# Patient Record
Sex: Male | Born: 1935 | Race: White | Hispanic: No | Smoking: Current every day smoker
Health system: Southern US, Community
[De-identification: ages and names within clinical notes are randomized; demographics above are authoritative.]

## PROBLEM LIST (undated history)

## (undated) DIAGNOSIS — I82409 Acute embolism and thrombosis of unspecified deep veins of unspecified lower extremity: Secondary | ICD-10-CM

## (undated) DIAGNOSIS — I739 Peripheral vascular disease, unspecified: Secondary | ICD-10-CM

## (undated) DIAGNOSIS — K449 Diaphragmatic hernia without obstruction or gangrene: Secondary | ICD-10-CM

## (undated) DIAGNOSIS — I2510 Atherosclerotic heart disease of native coronary artery without angina pectoris: Secondary | ICD-10-CM

## (undated) DIAGNOSIS — I10 Essential (primary) hypertension: Secondary | ICD-10-CM

## (undated) DIAGNOSIS — F418 Other specified anxiety disorders: Secondary | ICD-10-CM

## (undated) DIAGNOSIS — I219 Acute myocardial infarction, unspecified: Secondary | ICD-10-CM

## (undated) DIAGNOSIS — I429 Cardiomyopathy, unspecified: Secondary | ICD-10-CM

## (undated) DIAGNOSIS — E782 Mixed hyperlipidemia: Secondary | ICD-10-CM

## (undated) DIAGNOSIS — F0390 Unspecified dementia without behavioral disturbance: Secondary | ICD-10-CM

## (undated) DIAGNOSIS — M1990 Unspecified osteoarthritis, unspecified site: Secondary | ICD-10-CM

## (undated) DIAGNOSIS — C3490 Malignant neoplasm of unspecified part of unspecified bronchus or lung: Secondary | ICD-10-CM

## (undated) HISTORY — DX: Malignant neoplasm of unspecified part of unspecified bronchus or lung: C34.90

## (undated) HISTORY — DX: Acute myocardial infarction, unspecified: I21.9

## (undated) HISTORY — DX: Cardiomyopathy, unspecified: I42.9

## (undated) HISTORY — DX: Unspecified osteoarthritis, unspecified site: M19.90

## (undated) HISTORY — DX: Unspecified dementia, unspecified severity, without behavioral disturbance, psychotic disturbance, mood disturbance, and anxiety: F03.90

## (undated) HISTORY — DX: Acute embolism and thrombosis of unspecified deep veins of unspecified lower extremity: I82.409

## (undated) HISTORY — DX: Diaphragmatic hernia without obstruction or gangrene: K44.9

## (undated) HISTORY — DX: Peripheral vascular disease, unspecified: I73.9

## (undated) HISTORY — DX: Atherosclerotic heart disease of native coronary artery without angina pectoris: I25.10

## (undated) HISTORY — DX: Essential (primary) hypertension: I10

## (undated) HISTORY — DX: Mixed hyperlipidemia: E78.2

## (undated) HISTORY — DX: Other specified anxiety disorders: F41.8

---

## 1997-03-07 HISTORY — PX: LUNG LOBECTOMY: SHX167

## 1997-03-07 HISTORY — PX: PR VEIN BYPASS GRAFT,AORTO-FEM-POP: 35551

## 1997-08-06 ENCOUNTER — Ambulatory Visit: Admission: RE | Admit: 1997-08-06 | Discharge: 1997-08-06 | Payer: Self-pay | Admitting: *Deleted

## 1997-08-18 ENCOUNTER — Ambulatory Visit (HOSPITAL_COMMUNITY): Admission: RE | Admit: 1997-08-18 | Discharge: 1997-08-18 | Payer: Self-pay

## 1997-09-18 ENCOUNTER — Inpatient Hospital Stay
Admission: RE | Admit: 1997-09-18 | Discharge: 1997-09-22 | Payer: Self-pay | Admitting: Thoracic Surgery (Cardiothoracic Vascular Surgery)

## 1998-01-07 ENCOUNTER — Ambulatory Visit: Admission: RE | Admit: 1998-01-07 | Discharge: 1998-01-07 | Payer: Self-pay | Admitting: *Deleted

## 1998-01-13 ENCOUNTER — Encounter: Payer: Self-pay | Admitting: *Deleted

## 1998-01-14 ENCOUNTER — Inpatient Hospital Stay: Admission: RE | Admit: 1998-01-14 | Discharge: 1998-01-17 | Payer: Self-pay | Admitting: *Deleted

## 2006-03-07 DIAGNOSIS — I219 Acute myocardial infarction, unspecified: Secondary | ICD-10-CM

## 2006-03-07 HISTORY — DX: Acute myocardial infarction, unspecified: I21.9

## 2007-11-01 ENCOUNTER — Ambulatory Visit: Payer: Self-pay | Admitting: *Deleted

## 2008-05-08 ENCOUNTER — Ambulatory Visit: Payer: Self-pay | Admitting: *Deleted

## 2008-07-02 ENCOUNTER — Ambulatory Visit: Payer: Self-pay | Admitting: *Deleted

## 2008-07-02 ENCOUNTER — Ambulatory Visit (HOSPITAL_COMMUNITY): Admission: RE | Admit: 2008-07-02 | Discharge: 2008-07-02 | Payer: Self-pay | Admitting: *Deleted

## 2008-09-15 ENCOUNTER — Inpatient Hospital Stay (HOSPITAL_COMMUNITY): Admission: RE | Admit: 2008-09-15 | Discharge: 2008-09-22 | Payer: Self-pay | Admitting: *Deleted

## 2008-09-15 ENCOUNTER — Ambulatory Visit: Payer: Self-pay | Admitting: *Deleted

## 2008-09-15 HISTORY — PX: PR VEIN BYPASS GRAFT,AORTO-FEM-POP: 35551

## 2008-09-17 ENCOUNTER — Encounter (INDEPENDENT_AMBULATORY_CARE_PROVIDER_SITE_OTHER): Payer: Self-pay | Admitting: *Deleted

## 2008-09-18 ENCOUNTER — Ambulatory Visit: Payer: Self-pay | Admitting: Physical Medicine & Rehabilitation

## 2008-09-26 ENCOUNTER — Ambulatory Visit: Payer: Self-pay | Admitting: Vascular Surgery

## 2008-10-23 ENCOUNTER — Ambulatory Visit: Payer: Self-pay | Admitting: *Deleted

## 2008-11-13 ENCOUNTER — Emergency Department (HOSPITAL_COMMUNITY): Admission: EM | Admit: 2008-11-13 | Discharge: 2008-11-13 | Payer: Self-pay | Admitting: Emergency Medicine

## 2008-11-14 ENCOUNTER — Ambulatory Visit: Payer: Self-pay | Admitting: Vascular Surgery

## 2009-01-01 ENCOUNTER — Ambulatory Visit: Payer: Self-pay | Admitting: Surgery

## 2009-04-01 ENCOUNTER — Emergency Department (HOSPITAL_COMMUNITY): Admission: EM | Admit: 2009-04-01 | Discharge: 2009-04-01 | Payer: Self-pay | Admitting: Emergency Medicine

## 2009-08-14 ENCOUNTER — Ambulatory Visit: Payer: Self-pay | Admitting: Vascular Surgery

## 2010-05-23 LAB — CBC
HCT: 43.9 % (ref 39.0–52.0)
Hemoglobin: 15 g/dL (ref 13.0–17.0)
MCV: 97.8 fL (ref 78.0–100.0)
RDW: 15.8 % — ABNORMAL HIGH (ref 11.5–15.5)

## 2010-05-23 LAB — BLOOD GAS, ARTERIAL
Bicarbonate: 25.4 mEq/L — ABNORMAL HIGH (ref 20.0–24.0)
O2 Saturation: 97.2 %
Patient temperature: 98.6
TCO2: 21.8 mmol/L (ref 0–100)

## 2010-05-23 LAB — BASIC METABOLIC PANEL
CO2: 26 mEq/L (ref 19–32)
Chloride: 103 mEq/L (ref 96–112)
GFR calc non Af Amer: >60 mL/min (ref >60)
Glucose, Bld: 97 mg/dL (ref 70–99)
Potassium: 3.2 mEq/L — ABNORMAL LOW (ref 3.5–5.1)
Sodium: 138 mEq/L (ref 135–145)

## 2010-05-23 LAB — HEPATIC FUNCTION PANEL
ALT: 13 U/L (ref 0–53)
AST: 19 U/L (ref 0–37)
Bilirubin, Direct: 0.3 mg/dL (ref 0.0–0.3)
Total Bilirubin: 1 mg/dL (ref 0.3–1.2)

## 2010-05-23 LAB — DIFFERENTIAL
Basophils Absolute: 0 10*3/uL (ref 0.0–0.1)
Eosinophils Absolute: 0.1 10*3/uL (ref 0.0–0.7)
Eosinophils Relative: 1 % (ref 0–5)
Lymphocytes Relative: 14 % (ref 12–46)
Monocytes Absolute: 0.7 10*3/uL (ref 0.1–1.0)

## 2010-05-23 LAB — URINALYSIS, ROUTINE W REFLEX MICROSCOPIC
Glucose, UA: NEGATIVE mg/dL
Ketones, ur: NEGATIVE mg/dL
Nitrite: NEGATIVE
pH: 7 (ref 5.0–8.0)

## 2010-06-11 LAB — COMPREHENSIVE METABOLIC PANEL
Alkaline Phosphatase: 94 U/L (ref 39–117)
BUN: 6 mg/dL (ref 6–23)
Calcium: 9 mg/dL (ref 8.4–10.5)
Glucose, Bld: 107 mg/dL — ABNORMAL HIGH (ref 70–99)
Total Protein: 6.5 g/dL (ref 6.0–8.3)

## 2010-06-11 LAB — DIFFERENTIAL
Basophils Relative: 1 % (ref 0–1)
Monocytes Relative: 7 % (ref 3–12)
Neutro Abs: 9.8 10*3/uL — ABNORMAL HIGH (ref 1.7–7.7)
Neutrophils Relative %: 70 % (ref 43–77)

## 2010-06-11 LAB — CBC
HCT: 44.1 % (ref 39.0–52.0)
Hemoglobin: 15 g/dL (ref 13.0–17.0)
MCHC: 34.1 g/dL (ref 30.0–36.0)
RDW: 16.4 % — ABNORMAL HIGH (ref 11.5–15.5)

## 2010-06-11 LAB — PROTIME-INR
INR: 1 (ref 0.00–1.49)
Prothrombin Time: 13.2 seconds (ref 11.6–15.2)

## 2010-06-11 LAB — APTT: aPTT: 28 seconds (ref 24–37)

## 2010-06-13 LAB — POCT I-STAT 7, (LYTES, BLD GAS, ICA,H+H)
Bicarbonate: 30 mEq/L — ABNORMAL HIGH (ref 20.0–24.0)
Calcium, Ion: 1.13 mmol/L (ref 1.12–1.32)
HCT: 23 % — ABNORMAL LOW (ref 39.0–52.0)
Hemoglobin: 7.8 g/dL — CL (ref 13.0–17.0)
pCO2 arterial: 51.6 mmHg — ABNORMAL HIGH (ref 35.0–45.0)
pH, Arterial: 7.366 (ref 7.350–7.450)
pO2, Arterial: 430 mmHg — ABNORMAL HIGH (ref 80.0–100.0)

## 2010-06-13 LAB — GLUCOSE, CAPILLARY
Glucose-Capillary: 104 mg/dL — ABNORMAL HIGH (ref 70–99)
Glucose-Capillary: 110 mg/dL — ABNORMAL HIGH (ref 70–99)
Glucose-Capillary: 144 mg/dL — ABNORMAL HIGH (ref 70–99)
Glucose-Capillary: 145 mg/dL — ABNORMAL HIGH (ref 70–99)
Glucose-Capillary: 159 mg/dL — ABNORMAL HIGH (ref 70–99)

## 2010-06-13 LAB — BASIC METABOLIC PANEL
BUN: 4 mg/dL — ABNORMAL LOW (ref 6–23)
BUN: 5 mg/dL — ABNORMAL LOW (ref 6–23)
BUN: 7 mg/dL (ref 6–23)
BUN: 8 mg/dL (ref 6–23)
CO2: 25 mEq/L (ref 19–32)
CO2: 26 mEq/L (ref 19–32)
CO2: 24 mEq/L (ref 19–32)
CO2: 25 mEq/L (ref 19–32)
Calcium: 7.1 mg/dL — ABNORMAL LOW (ref 8.4–10.5)
Calcium: 7.3 mg/dL — ABNORMAL LOW (ref 8.4–10.5)
Calcium: 7.7 mg/dL — ABNORMAL LOW (ref 8.4–10.5)
Chloride: 105 mEq/L (ref 96–112)
Chloride: 101 mEq/L (ref 96–112)
Chloride: 103 mEq/L (ref 96–112)
Creatinine, Ser: 0.6 mg/dL (ref 0.4–1.5)
Creatinine, Ser: 0.65 mg/dL (ref 0.4–1.5)
Creatinine, Ser: 0.71 mg/dL (ref 0.4–1.5)
Creatinine, Ser: 0.7 mg/dL (ref 0.4–1.5)
GFR calc Af Amer: >60 mL/min (ref >60)
GFR calc Af Amer: >60 mL/min (ref >60)
GFR calc Af Amer: >60 mL/min (ref >60)
GFR calc non Af Amer: >60 mL/min (ref >60)
GFR calc non Af Amer: >60 mL/min (ref >60)
GFR calc non Af Amer: >60 mL/min (ref >60)
Glucose, Bld: 104 mg/dL — ABNORMAL HIGH (ref 70–99)
Glucose, Bld: 81 mg/dL (ref 70–99)
Glucose, Bld: 168 mg/dL — ABNORMAL HIGH (ref 70–99)
Potassium: 3.7 mEq/L (ref 3.5–5.1)
Potassium: 2.7 mEq/L — CL (ref 3.5–5.1)
Potassium: 3 mEq/L — ABNORMAL LOW (ref 3.5–5.1)
Sodium: 132 mEq/L — ABNORMAL LOW (ref 135–145)
Sodium: 134 mEq/L — ABNORMAL LOW (ref 135–145)
Sodium: 131 mEq/L — ABNORMAL LOW (ref 135–145)

## 2010-06-13 LAB — TYPE AND SCREEN
ABO/RH(D): A POS
Antibody Screen: NEGATIVE

## 2010-06-13 LAB — DIFFERENTIAL
Basophils Relative: 1 % (ref 0–1)
Eosinophils Absolute: 0.2 10*3/uL (ref 0.0–0.7)
Eosinophils Relative: 1 % (ref 0–5)
Lymphs Abs: 3.9 10*3/uL (ref 0.7–4.0)
Monocytes Relative: 5 % (ref 3–12)
Neutrophils Relative %: 74 % (ref 43–77)

## 2010-06-13 LAB — CBC
HCT: 20.3 % — ABNORMAL LOW (ref 39.0–52.0)
HCT: 30.3 % — ABNORMAL LOW (ref 39.0–52.0)
HCT: 31 % — ABNORMAL LOW (ref 39.0–52.0)
HCT: 31.8 % — ABNORMAL LOW (ref 39.0–52.0)
HCT: 48.9 % (ref 39.0–52.0)
Hemoglobin: 10.4 g/dL — ABNORMAL LOW (ref 13.0–17.0)
Hemoglobin: 7.1 g/dL — CL (ref 13.0–17.0)
Hemoglobin: 8.6 g/dL — ABNORMAL LOW (ref 13.0–17.0)
MCHC: 33.9 g/dL (ref 30.0–36.0)
MCHC: 35.5 g/dL (ref 30.0–36.0)
MCHC: 34.5 g/dL (ref 30.0–36.0)
MCHC: 35 g/dL (ref 30.0–36.0)
MCHC: 35.5 g/dL (ref 30.0–36.0)
MCHC: 36.1 g/dL — ABNORMAL HIGH (ref 30.0–36.0)
MCV: 93.1 fL (ref 78.0–100.0)
MCV: 93.5 fL (ref 78.0–100.0)
MCV: 91.2 fL (ref 78.0–100.0)
MCV: 93.6 fL (ref 78.0–100.0)
MCV: 95.1 fL (ref 78.0–100.0)
MCV: 95.2 fL (ref 78.0–100.0)
MCV: 95.8 fL (ref 78.0–100.0)
Platelets: 146 10*3/uL — ABNORMAL LOW (ref 150–400)
Platelets: 145 10*3/uL — ABNORMAL LOW (ref 150–400)
Platelets: 162 10*3/uL (ref 150–400)
Platelets: 193 10*3/uL (ref 150–400)
Platelets: 290 10*3/uL (ref 150–400)
RBC: 2.62 MIL/uL — ABNORMAL LOW (ref 4.22–5.81)
RBC: 3.16 MIL/uL — ABNORMAL LOW (ref 4.22–5.81)
RBC: 3.34 MIL/uL — ABNORMAL LOW (ref 4.22–5.81)
RDW: 16.2 % — ABNORMAL HIGH (ref 11.5–15.5)
RDW: 16.5 % — ABNORMAL HIGH (ref 11.5–15.5)
RDW: 15.5 % (ref 11.5–15.5)
RDW: 15.9 % — ABNORMAL HIGH (ref 11.5–15.5)
RDW: 16 % — ABNORMAL HIGH (ref 11.5–15.5)
WBC: 8.8 10*3/uL (ref 4.0–10.5)
WBC: 12.1 10*3/uL — ABNORMAL HIGH (ref 4.0–10.5)
WBC: 14.3 10*3/uL — ABNORMAL HIGH (ref 4.0–10.5)
WBC: 15.7 10*3/uL — ABNORMAL HIGH (ref 4.0–10.5)
WBC: 19.7 10*3/uL — ABNORMAL HIGH (ref 4.0–10.5)
WBC: 9.9 10*3/uL (ref 4.0–10.5)

## 2010-06-13 LAB — ABO/RH: ABO/RH(D): A POS

## 2010-06-13 LAB — COMPREHENSIVE METABOLIC PANEL
AST: 23 U/L (ref 0–37)
Albumin: 3.8 g/dL (ref 3.5–5.2)
BUN: 8 mg/dL (ref 6–23)
Calcium: 9.5 mg/dL (ref 8.4–10.5)
Chloride: 95 mEq/L — ABNORMAL LOW (ref 96–112)
Creatinine, Ser: 0.92 mg/dL (ref 0.4–1.5)
GFR calc Af Amer: >60 mL/min (ref >60)
Total Bilirubin: 1.4 mg/dL — ABNORMAL HIGH (ref 0.3–1.2)
Total Protein: 6.7 g/dL (ref 6.0–8.3)

## 2010-06-13 LAB — APTT: aPTT: 32 seconds (ref 24–37)

## 2010-06-13 LAB — POTASSIUM
Potassium: 3 mEq/L — ABNORMAL LOW (ref 3.5–5.1)
Potassium: 3.5 mEq/L (ref 3.5–5.1)

## 2010-06-13 LAB — CARDIAC PANEL(CRET KIN+CKTOT+MB+TROPI)
CK, MB: 3 ng/mL (ref 0.3–4.0)
Relative Index: 2.4 (ref 0.0–2.5)
Relative Index: 2.6 — ABNORMAL HIGH (ref 0.0–2.5)
Relative Index: INVALID (ref 0.0–2.5)
Troponin I: 0.02 ng/mL (ref 0.00–0.06)

## 2010-06-13 LAB — MRSA PCR SCREENING: MRSA by PCR: NEGATIVE

## 2010-06-13 LAB — PROTIME-INR: INR: 1 (ref 0.00–1.49)

## 2010-06-13 LAB — URINALYSIS, ROUTINE W REFLEX MICROSCOPIC
Nitrite: NEGATIVE
Protein, ur: NEGATIVE mg/dL
Specific Gravity, Urine: 1.018 (ref 1.005–1.030)
Urobilinogen, UA: 2 mg/dL — ABNORMAL HIGH (ref 0.0–1.0)

## 2010-06-16 LAB — POCT I-STAT, CHEM 8
Chloride: 93 mEq/L — ABNORMAL LOW (ref 96–112)
HCT: 53 % — ABNORMAL HIGH (ref 39.0–52.0)
Potassium: 3.3 mEq/L — ABNORMAL LOW (ref 3.5–5.1)
Sodium: 130 mEq/L — ABNORMAL LOW (ref 135–145)

## 2010-07-20 NOTE — Consult Note (Signed)
VASCULAR SURGERY CONSULTATION   [name], [name]  DOB:  Dec 02, 1933                                       11/01/2007  ZOXWR#:[id number]   CHIEF COMPLAINT:  Bilateral leg pain and weakness.   HISTORY:  The patient is a 77 year old gentleman with a history of lower  extremity revascularization.  He underwent right common femoral  endarterectomy, right-to-left femoral-femoral bypass, and right femoral  popliteal bypass with with saphenous vein graft in November of 1999.  He  has been lost to follow-up since 1999.  His last ankle brachial indices  measured in this office were in December of 1999, measuring 0.64 on the  right leg and 0.56 on the left leg.   He underwent Dopplers today with ankle brachial indices 0.50 on the  right and 0.31 on the left.   His current symptoms relate to weakness in his legs and an inability to  walk, he states he can only walk 15-20 yards.  Does not have any night  pain or rest pain.  He ambulates with the assistance of a cane.   PAST MEDICAL HISTORY:  1. Hypertension.  2. Hyperlipidemia.  3. Coronary artery disease.  4. Tobacco abuse.  5. Lung cancer.   MEDICATIONS:  1. Hydrochlorothiazide 25 mg daily.  2. Simvastatin 40 mg daily.  3. Metoprolol 25 mg daily.   ALLERGIES:  None known.   FAMILY HISTORY:  Noncontributory.   SOCIAL HISTORY:  The patient is single.  He has 2 grown children.  He  continues to smoke 1 to 1-1/2 packs of cigarettes daily.  Does not  consume alcohol.   REVIEW OF SYSTEMS:  Refer to Patient Encounter Form.  The patient notes  weight loss, he weighs 175 pounds, 6 feet tall.  He has a rash in his  groins.  He describes arthritic and joint pain.  Chronic muscle pain.  Unsteady gait.  Refer to Patient Encounter Form.   PHYSICAL EXAMINATION:  General:  A moderately frail, debilitated  appearing, 77 year old male.  He is alert and oriented.  In no acute  distress.  Chest:  Equal air entry bilaterally without  rales or rhonchi.  Cardiovascular:  Normal heart sounds without murmurs.  No carotid  bruits.  Abdomen:  Soft, nontender.  No masses or organomegaly.  Lower  Extremities:  Absent femoral, popliteal, posterior tibial, and dorsalis  pedis pulses bilaterally.  Occluded right-to-left femoral-femoral  bypass.  Feet revealed moderate dependent rubor.  No ulceration or  gangrene.   IMPRESSION:  1. Advanced multilevel peripheral vascular disease.  Failed right-to-      left femoral-femoral bypass and right femoral popliteal bypass.  2. Coronary artery disease.  3. Hypertension.  4. Hyperlipidemia.  5. Lung carcinoma.  6. Ongoing tobacco abuse.   RECOMMENDATION:  Although the patient does have evidence of advanced  peripheral vascular disease, he is at no risk of immediate limb loss.  He appears to be moderate to severely debilitated and has limited  ambulation intolerance due to multiple factors beyond just his vascular  disease.  I have recommended no further workup at this time and would  not plan intervention unless limb salvage situation became apparent.   Balinda Quails, M.D.  Electronically Signed  PGH/MEDQ  D:  11/01/2007  T:  11/02/2007  Job:  [id number]   cc:   Delaney Meigs,  M.D. 

## 2010-07-20 NOTE — Procedures (Signed)
BYPASS GRAFT EVALUATION   INDICATION:  Followup left axillary bifemoral/right fem-pop bypass  graft.   HISTORY:  Diabetes:  No.  Cardiac:  No.  Hypertension:  Yes.  Smoking:  Yes.  Previous Surgery:  Left axillary to bifemoral bypass graft/right femoral-  popliteal bypass graft on 09/15/2008 by Dr. Madilyn Fireman.   SINGLE LEVEL ARTERIAL EXAM                               RIGHT              LEFT  Brachial:                    154                133  Anterior tibial:             140                60  Posterior tibial:            135                60  Peroneal:  Ankle/brachial index:        0.91               0.39   PREVIOUS ABI:  Date:  01/01/2009  RIGHT:  0.88  LEFT:  0.49   LOWER EXTREMITY BYPASS GRAFT DUPLEX EXAM:   DUPLEX:  Patent left axillary to bifemoral and right fem-pop bypass  graft, elevated velocities of 373 cm at the proximal left axillary to  bifemoral bypass graft.   IMPRESSION:  1. Decrease in left ankle brachial index from previous study which      still suggests severe arterial disease.  2. Stable right ankle brachial index.  3. Patent left axillary to bifemoral and right femoral to popliteal      bypass graft with elevated velocities as described above.   ___________________________________________  Larina Earthly, M.D.   CB/MEDQ  D:  08/14/2009  T:  08/14/2009  Job:  [id number]

## 2010-07-20 NOTE — Op Note (Signed)
NAME:  CASEAntwoine, Zorn NO.:  [id number]   MEDICAL RECORD NO.:  [id number]          PATIENT TYPE:  INP   LOCATION:  2304                         FACILITY:  [facility]   PHYSICIAN:  Balinda Quails, M.D.    DATE OF BIRTH:  1933/12/30   DATE OF PROCEDURE:  09/15/2008  DATE OF DISCHARGE:                               OPERATIVE REPORT   SURGEON:  Balinda Quails, MD   ASSISTANTS:  1. Di Kindle.  Edilia Bo, MD  2. Wilmon Arms, PA   ANESTHETIC:  General endotracheal.   ANESTHESIOLOGIST:  Kaylyn Layer. Ossey, MD   PREOPERATIVE DIAGNOSIS:  Bilateral lower extremity ischemic rest pain.   POSTOPERATIVE DIAGNOSIS:  Bilateral lower extremity ischemic rest pain.   PROCEDURES:  1. Left axillobifemoral bypass.  2. Left profundoplasty.  3. Repair of right femoral pseudoaneurysm.  4. Reimplantation of right femoral-popliteal bypass.   CLINICAL NOTE:  Boston Thayer is a 77 year old male with a history of  known peripheral vascular disease.  Approximately 10 years ago he  underwent right-to-left femoral-femoral bypass and right femoral-  popliteal bypass.  He was lost to followup for approximately 10 years.  Represented with bilateral ischemic rest pain.  Workup for this revealed  an occluded infrarenal aorta.  Occluded right-to-left femoral-femoral  bypass.  Patent right femoral-popliteal graft.  Severely diseased left  superficial femoral artery with subtotal occlusion.   Brought to the operating room at this time for left axillobifemoral  bypass.   OPERATIVE PROCEDURE:  The patient was brought to the operating room in  stable condition.  Placed under general endotracheal anesthesia.  In the  supine position the abdomen and both legs were prepped and draped in a  sterile fashion.  Foley catheter and arterial line in place.   Transverse skin incision made in the left infraclavicular space.  Subcutaneous tissue divided with electrocautery.  The left pectoralis  major muscle  was divided.  The pectoralis minor muscle was retracted  laterally.  Left axillary artery was exposed.  Brachial plexus was  identified coursing over the axillary artery and was preserved.  Tributaries of the axillary artery were freed, ligated with 3-0 silk and  divided.  Adequate length of axillary artery was mobilized and encircled  with a vessel loop.   A longitudinal skin incision made through the left groin.  Subcutaneous  tissue divided with electrocautery.  This was a redo groin.  Dissection  carried down to expose an occluded left limb of the femoral-femoral  graft.  This was followed down to an end-to-side anastomosis to the left  common femoral artery extending on the left superficial femoral artery.  The left common femoral artery was mobilized back up under the inguinal  ligament and the left external iliac artery was encircled with the  vessel loop.  The femoral-femoral graft chronically occluded was divided  allowing mobilization of the common femoral artery.  The left  superficial femoral artery was freed and encircled with the vessel loop  and mobilized distally.  The left profunda femoris artery origin  identified.  This was occluded proximally, mobilized distally  down to  the first major division branches which were encircled with vessel  loops.   Longitudinal skin incision then made in the right groin.  This was also  redo groin.  Subcutaneous tissue divided with electrocautery.  A right  femoral pseudoaneurysm was identified.  This was arising from the hood  of the right-to-left femoral-femoral graft.  The right femoral-popliteal  vein graft was identified.  This was freed, dissected out, mobilized and  encircled with the vessel loop.  This had separated from the hood of the  femoral-femoral graft and was mobilized and controlled with a bulldog  clamp.  The pseudoaneurysm was then dissected out and the common femoral  artery exposed.  The right profunda femoris  artery was occluded at its  origin.   The right external iliac artery was mobilized up and the inguinal  ligament encircled with vessel loop.   A long tunnel was then created deep to the pectoralis muscles extending  from the left axillary incision to the left groin.  An 8-mm Propaten  ringed graft was placed through the tunnel.  A redo femoral-femoral  tunnel was then created in suprapubic plane and a 7-mm Dacron graft  placed through this.   The patient was then administered 7000 units of heparin intravenously.   The left axillary artery was controlled proximally and distally with  bulldog clamps.  A longitudinal arteriotomy made.  The Propaten 8 mm  Gore-Tex graft was beveled and anastomosed end-to-side to the left  axillary artery using running 6-0 Prolene suture.  At completion of this  anastomosis, the graft was flushed and controlled with a Fogarty clamp.  The descending limb of the axillofemoral graft was then brought down to  the left profunda femoris artery.  The left profunda femoris artery  controlled with bulldog clamps.  A longitudinal arteriotomy made just  proximal to its first division branches.  The vessel was patent at this  level and the descending limb of the axillofemoral graft was anastomosed  end-to-side to the left profunda femoris artery using running 6-0  Prolene suture.  The graft then flushed and clamps were removed.  The 7-  mm Dacron graft placed through the femoral-femoral tunnel was then  anastomosed end-to-side to the descending limb of the left axillofemoral  graft.  The left axillofemoral graft was controlled proximally and  distally with fistula clamps.  A transverse graft incision made.  The 7-  mm Dacron graft was beveled and anastomosed end-to-side to the  descending limb of the left axillofemoral graft using running 6-0  Prolene suture.  This was then flushed and clamped.   In the right groin, the femoral vessels were controlled with  clamps.  The pseudoaneurysm was resected down to the common femoral artery and  the right limb of the femoral-femoral graft was anastomosed end-to-side  to the right common femoral artery using running 5-0 Prolene suture.  Following this, the hood of the graft was opened longitudinally and the  right femoral-popliteal vein graft was anastomosed end-to-side to the  hood of the right limb of the femoral-femoral graft using running 6-0  Prolene suture.  After adequate flushing, clamps were then removed.   There was considerable bleeding and the patient was administered 50 mg  of protamine intravenously, 20 mcg of DDAVP intravenously and mechanical  measures including CoSeal and thrombin and Gelfoam were used to aid with  hemostasis.   The patient received 2 units of blood during the operative procedure.  Adequate urine output  was present.   The axillary incision was then closed with an interrupted 2-0 Vicryl  suture in the pectoralis muscle layer.  Running 3-0 Vicryl suture in the  subcutaneous layer and staples applied to the skin.   Both groin incisions were closed with running 2-0 Vicryl suture in two  subcutaneous layers and staples applied to the skin.   The patient was stable throughout the operative procedure.  Transferred  to the recovery room in stable condition.      Balinda Quails, M.D.  Electronically Signed     PGH/MEDQ  D:  09/15/2008  T:  09/16/2008  Job:  [id number]   cc:   Delaney Meigs, M.D.

## 2010-07-20 NOTE — H&P (Signed)
NAME:  [name] [name], [name] [name] NO.:  [id number]   MEDICAL RECORD NO.:  [id number]          PATIENT TYPE:  INP   LOCATION:  2304                         FACILITY:  [facility]   PHYSICIAN:  Balinda Quails, M.D.    DATE OF BIRTH:  [birth year]   DATE OF ADMISSION:  09/15/2008  DATE OF DISCHARGE:                              HISTORY & PHYSICAL   PRIMARY CARE PHYSICIAN:  Delaney Meigs, MD   ADMISSION DIAGNOSIS:  Bilateral lower extremity ischemia.   HISTORY:  Mr. [name] [name] is a 77 year old gentleman with history of  lower extremity revascularization, he has previously undergone a right  common femoral endarterectomy, right-to-left femoral-femoral bypass, and  right femoropopliteal bypass in 1999.  He was lost to followup.  He  represented to the office approximately a year ago complaining of lower  extremity weakness and inability to walk.  At that time, he had no night  pain or rest pain.  He was ambulating with the assistance of a cane.   He represented to the office with worsening symptoms of leg pain.  Complaining of night pain and rest pain.  Ankle brachial indices were  noted to be markedly reduced at 0.38 on the right and inaudible on the  left.  He has undergone an arteriogram of the brachial approach and this  revealed occlusion of the infrarenal aorta with reconstitution of the  profunda femoris arteries bilaterally and occluded right femoropopliteal  graft, occluded right-to-left femoral-femoral graft.   PAST MEDICAL HISTORY:  1. Hypertension.  2. Hyperlipidemia.  3. Coronary artery disease.  4. Tobacco abuse.  5. Lung cancer.   MEDICATIONS:  1. Metoprolol 25 mg b.i.d.  2. Plavix 75 mg daily.  3. Simvastatin 40 mg daily.  4. Hydrochlorothiazide 25 mg daily.   ALLERGIES:  None known.   FAMILY HISTORY:  Noncontributory.   SOCIAL HISTORY:  The patient is single.  He was incarcerated for  approximately 10 years.  He continues to smoke approximately 1 to  1-1/2  packs of cigarettes daily.  Does not consume alcohol.   REVIEW OF SYSTEMS:  The patient notes chronic weight loss.  He also  notes arthritic pain and joint discomfort.  Chronic muscle pain.  Unsteady gait.   PHYSICAL EXAMINATION:  GENERAL:  Frail-appearing 77 year old male.  He  is alert and oriented.  No acute distress.  HEENT:  Unremarkable.  NECK:  Supple.  No thyromegaly or adenopathy.  CHEST:  Equal air entry bilaterally with distant breath sounds.  No  rales or rhonchi.  CARDIOVASCULAR:  Normal heart sounds without murmurs.  No carotid  bruits.  ABDOMEN:  Soft and nontender.  No masses  or organomegaly.  LOWER EXTREMITIES:  Absent femoropopliteal, posterior tibia, and  dorsalis pedis pulses bilaterally.  Bilateral groin scars.  Marked  dependent rubor present bilaterally.  No ulceration or gangrene.  NEUROLOGIC:  Cranial nerves intact.  Strength equal bilaterally.  1+  reflexes.   IMPRESSION:  1. Advanced multilevel peripheral vascular disease with infrarenal      aortic occlusion and failed right-to-left femoral-femoral bypass  and right femoropopliteal bypass.  2. Coronary artery disease.  3. Hypertension.  4. Hyperlipidemia.  5. Lung carcinoma.  6. Ongoing tobacco abuse.   PLAN:  The patient will be admitted to [facility] for left  axillobifemoral bypass for limb salvage.  Extra-anatomical bypass  undertaken due to the patient's underlying severe debilitated state and  ongoing tobacco abuse.      Balinda Quails, M.D.  Electronically Signed     PGH/MEDQ  D:  09/15/2008  T:  09/15/2008  Job:  [id number]

## 2010-07-20 NOTE — Assessment & Plan Note (Signed)
OFFICE VISIT   [name], [name]  DOB:  August 27, 1933                                       08/14/2009  JWJXB#:[id number]   The patient presents today for followup of his diffuse peripheral  vascular occlusive disease.  He is status post left axillary to femoral  fem-fem bypass and right fem-pop bypass by Dr. Liliane Bade on September 15, 2008.  He reports that he is quite miserable overall.  He reports that  he cannot walk, he cannot sleep and he cannot eat.  He has chronic right  hip pain and is able to get no relief from this.  He does not report any  resting symptoms in his feet.  He does have history of hypertension and  elevated cholesterol.  He does drink 1 pint of alcohol a day and smokes  2 packs of cigarettes per day.   REVIEW OF SYSTEMS:  He has multiple positives on his review of systems  as documented in his chart.   PHYSICAL EXAMINATION:  He has a palpable left ax fem, palpable fem-fem  pulse and also has palpable right popliteal pulse.  His feet are well-  perfused.  His ankle-arm index on the right on vascular lab studies is  stable at 0.91, on the left is down slightly to 0.39 from his last study  in October of 0.49.   I discussed this at length with the patient and his daughter present.  I  do not see any evidence of peripheral vascular occlusive disease to  cause the symptoms that he is having.  He is requesting pain medicine  and also something for sleep.  I have deferred this, explaining that I  do not see any vascular pathology to treat.  He will continue to follow  up with Dr. Lysbeth Galas.     Larina Earthly, M.D.  Electronically Signed   TFE/MEDQ  D:  08/14/2009  T:  08/17/2009  Job:  [id number]   cc:   Delaney Meigs, M.D.

## 2010-07-20 NOTE — Procedures (Signed)
BYPASS GRAFT EVALUATION   INDICATION:  Followup right lower extremity bypass graft.   HISTORY:  Diabetes:  no  Cardiac:  no  Hypertension:  yes  Smoking:  previous  Previous Surgery:  Left axillary to bi-femoral and right femoral  popliteal bypass grafts on 09/15/2008 by Dr. Madilyn Fireman   SINGLE LEVEL ARTERIAL EXAM                               RIGHT              LEFT  Brachial:                    156                144  Anterior tibial:             126                77  Posterior tibial:            137                76  Peroneal:  Ankle/brachial index:        0.88               0.49   PREVIOUS ABI:  Date: 10/23/2008  RIGHT:  0.75  LEFT:  0.42   LOWER EXTREMITY BYPASS GRAFT DUPLEX EXAM:   DUPLEX:  Biphasic Doppler waveforms noted throughout the right lower  extremity bypass graft and its native vessels with no increase in  velocities.   IMPRESSION:  1. Patent right femoral popliteal bypass graft with no evidence of      stenosis.  2. Stable bilateral ankle brachial indices.       ___________________________________________  V. Charlena Cross, MD   CH/MEDQ  D:  01/02/2009  T:  01/03/2009  Job:  [id number]

## 2010-07-20 NOTE — Assessment & Plan Note (Signed)
OFFICE VISIT   Boghosian, [name] [name]  DOB:  1934-01-13                                       10/23/2008  ZOXWR#:[id number]   The patient  underwent left axillobifemoral bypass and reimplantation of  right femoral popliteal bypass on July 12.  This was carried out for  limb salvage.  He has noticed marked improvement in his lower extremity  pain.  He still has limited ambulation ability.   Axillobifemoral bypass is patent with ankle brachial indices measuring  0.75 on the right, 0.42 on the left.   On examination blood pressure is 99/53, pulse is 82 per minute.  Incisions are well-healed.  Grafts are patent.  Feet are well-perfused.   The patient is doing reasonably well following his recent limb salvage  axillobifemoral procedure on July 12.   Plan:  Follow-up per protocol.   Balinda Quails, M.D.  Electronically Signed   PGH/MEDQ  D:  11/13/2008  T:  11/13/2008  Job:  [id number]

## 2010-07-20 NOTE — Assessment & Plan Note (Signed)
OFFICE VISIT   [name] [name], [name] [name]  DOB:  1933/09/29                                       11/14/2008  ZOXWR#:[id number]   The patient presented today for evaluation of his recent graft with Dr.  Madilyn Fireman.  He, on September 15, 2008, underwent left femoral bypass, repair of  right femoral false aneurysm, and reimplantation of his right fem-pop  bypass.  He presented to [facility] Emergency department [facility] with  concern regarding swelling over his lateral rib cage.  He, at that time,  was not complaining of any distal symptoms.  On physical exam today, he  does look quite good.  He has well-perfused extremities bilaterally.  The incisional wounds are all well-healed.  He does have some tortuosity  of his graft as it crosses his ribcage.  On the left lateral chest, this  is actually the graft and he does have a pulse in his ax-fem graft.  His  fem-fem graft is patent as well and he does have palpable right  popliteal graft pulse  I have reassured the patient of this explaining  that all of his grafts are patent and I would recommend continued  observation time and he will see [place] in our vascular lab protocol.   Larina Earthly, M.D.  Electronically Signed   TFE/MEDQ  D:  11/14/2008  T:  11/17/2008  Job:  [id number]

## 2010-07-20 NOTE — Op Note (Signed)
NAME:  [name] [name], [name] [name] NO.:  [id number]   MEDICAL RECORD NO.:  [id number]          PATIENT TYPE:  AMB   LOCATION:  SDS                          FACILITY:  [facility]   PHYSICIAN:  Balinda Quails, M.D.    DATE OF BIRTH:  09-11-1933   DATE OF PROCEDURE:  07/02/2008  DATE OF DISCHARGE:  07/02/2008                               OPERATIVE REPORT   SURGEON:  Balinda Quails, MD   DIAGNOSIS:  Bilateral lower extremity ischemic rest pain.   PROCEDURE:  Abdominal aortogram, bilateral lower extremity runoff  arteriography.   ACCESS:  Left brachial with ultrasound localization.   COMPLICATIONS:  None apparent.   CONTRAST:  134 mL Visipaque.   CLINICAL NOTE:  [name] [name]. [name] [name] is a 77 year old gentleman with severe  advanced peripheral vascular disease.  He has a history of heavy tobacco  use.  His ankle brachial indices measure 0.41 on the right and 0.31 on  the left.  He has severe monophasic dampened tibial wave forms.  Night  pain and ischemic rest pain.   His past medical history also includes coronary artery disease, tobacco  abuse, lung CA, hyperlipidemia, and hypertension.   PROCEDURE NOTE:  The patient was brought to cath lab in stable  condition.  Placed in supine position.  The left arm prepped and draped  in a sterile fashion.  Skin and subcutaneous tissues were instilled with  1% Xylocaine.  Ultrasound localization of left brachial artery was  carried out.  Using ultrasound for localization, the left brachial  artery was accessed with a micropuncture needle.  The guidewire was  advanced through the needle.  The micropuncture catheter was advanced  over the guidewire.  The guidewire was removed.  This was then exchanged  for a 0.035 Wholey guidewire.  The micropuncture sheath removed and a 5-  French sheath advanced over the guidewire.  Left brachial arterial  injection of nitroglycerin 200 mcg and heparin were carried out.   The Wholey guidewire was then  advanced up into the left subclavian  artery.  Using the catheter guidance, a pigtail catheter, right coronary  catheter and IMA catheter were used to eventually direct the guidewire  the descending thoracic aorta.  The guidewire descended and then  advanced down into the abdominal aorta.  The pigtail catheter advanced  over guidewire to the level of the renal arteries.   Abdominal aortogram was obtained.  This revealed the renal arteries to  be patent bilaterally.  Moderate-to-severe stenosis of the right renal  artery.  Moderate stenosis of the left renal artery.  The infrarenal  aorta was severely diseased and occluded at the level of the IMA  takeoff.  The IMA was patent.  Extensive IMA collaterals were noted in  the pelvis.  Extensive pelvic collaterals then reconstituted the  profunda femoris artery bilaterally.   Lower extremity runoff arteriography revealed the left superficial  femoral artery to be patent, although subtotal occlusion present.  The  right superficial femoral artery reconstituted at the adductor canal  popliteal junction.  The tibial vessels were severely diseased with poor  visualization.   This completed the arteriogram procedure.  Guidewire reinserted and  catheter removed.  Left brachial sheath left in place and transferred to  the recovery room.   FINAL IMPRESSION:  1. Severe right renal artery stenosis, moderate left renal artery      stenosis.  2. Infrarenal aortic occlusion.  3. Reconstitution of the profunda femoris arteries bilaterally.  4. Occluded right superficial femoral artery and subtotal occlusion of      left superficial femoral artery.   PLAN:  The patient will be discharged today.  Plan for axillobifemoral  bypass for limb salvage.  The patient is not a candidate for  aortobifemoral bypass due to the underlying severe COPD and debilitative  state.      Balinda Quails, M.D.  Electronically Signed     PGH/MEDQ  D:  07/02/2008  T:   07/03/2008  Job:  [id number]

## 2010-07-20 NOTE — Assessment & Plan Note (Signed)
OFFICE VISIT   [name] [name], [name] [name]  DOB:  09-Jan-1934                                       05/08/2008  EAVWU#:[id number]   The patient returned to the office today complaining of increasing pain  in his legs bilaterally.  He is having difficulty ambulating without  pain.  Having pain at night.  He has a history of failed lower extremity  revascularization, right to left femoral-femoral bypass, right femoral  popliteal bypass all occluded which were carried out in 1999.   Ankle brachial indices measure 0.38 on the right and are absent on the  left.  The patient has no femoral pulses bilaterally.  No popliteal,  posterior tibial or dorsalis pedis pulses.  There is no acute ischemia,  chronic changes noted.  Dependent rubor present.  His brachial pulses  are present bilaterally.   I have scheduled him to undergo an aortogram tomorrow via left brachial  stick.  He likely will require an axillobifemoral bypass for limb  salvage.   Balinda Quails, M.D.  Electronically Signed   PGH/MEDQ  D:  05/08/2008  T:  05/09/2008  Job:  [id number]   cc:   Delaney Meigs, M.D.

## 2010-07-20 NOTE — Discharge Summary (Signed)
NAME:  [name] [name], [name] [name] NO.:  [id number]   MEDICAL RECORD NO.:  [id number]          PATIENT TYPE:  INP   LOCATION:  2012                         FACILITY:  [facility]   PHYSICIAN:  Balinda Quails, M.D.    DATE OF BIRTH:  29-Oct-1933   DATE OF ADMISSION:  09/15/2008  DATE OF DISCHARGE:  09/22/2008                               DISCHARGE SUMMARY   FINAL DISCHARGE DIAGNOSES:  1. Bilateral ischemic rest pain due to an occluded infrarenal aorta      and an occluded right to left femoral-femoral bypass.  2. Dyslipidemia.  3. Hypertension.   PROCEDURE PERFORMED:  Left axillobifemoral bypass with left  profundoplasty and repair of right femoral pseudoaneurysm with  reimplantation of right femoropopliteal bypass grafting on September 15, 2008, by Dr. Madilyn Fireman.   COMPLICATIONS:  None.   CONDITION AT DISCHARGE:  Stable improving.   DISCHARGE MEDICATIONS:  He is instructed to resume all previous  medications consisting of:  1. Metoprolol tartrate 25 mg p.o. b.i.d.  2. Plavix 75 mg p.o. daily.  3. Simvastatin 40 mg p.o. daily.  4. Hydrochlorothiazide 25 mg p.o. daily.  5. He is given a prescription for Percocet 5/325 one p.o. q.4 h.      p.r.n. pain, total #30 were given.   DISPOSITION:  He is being discharged home to the care of his daughter.  He is given careful instructions regarding the care of his wounds and  activity level.  He is given an appointment to see Dr. Madilyn Fireman in 2 weeks  and follow up with ABIs and have his staples removed on Friday, September 26, 2008.  The office will arrange the appointments.   BRIEF IDENTIFYING STATEMENT:  For complete details, please refer the  typed history and physical.   Briefly, this very pleasant 77 year old gentleman with a long history of  peripheral vascular disease was evaluated by Dr. Madilyn Fireman for bilateral  lower extremity claudication.  Dr. Madilyn Fireman found him to be a suitable  candidate for aortobifemoral bypass grafting.  He was informed  of the  risks and benefits of the procedure, and after careful consideration,  elected to proceed with surgery.   HOSPITAL COURSE:  Preoperative workup was completed as an outpatient.  He was brought in through same-day surgery and underwent the  aforementioned revascularization procedure.  For complete details,  please refer the typed operative report.  The procedure was without  complications.  He was returned to the Post Anesthesia Care Unit  extubated.  Following stabilization, he was transferred to a bed in a  surgical step-down unit.  He continued to remain stable.  He was able to  be transferred to a bed on a surgical convalescent floor by the third  postoperative day.  Following his transfer, his diet and activity levels  were advanced as tolerated.  He was walking and improving with  physical therapy.  His wounds were healing well.  He was desirous of  discharge and was subsequently discharged home in stable condition on  September 22, 2008.  Arrangements were made for home health physical therapy  to assist with ambulation and due to his debilitated state.      Wilmon Arms, PA      P. Liliane Bade, M.D.  Electronically Signed    KEL/MEDQ  D:  09/22/2008  T:  09/22/2008  Job:  [id number]

## 2011-10-06 ENCOUNTER — Encounter: Payer: Self-pay | Admitting: Vascular Surgery

## 2011-10-19 ENCOUNTER — Telehealth: Payer: Self-pay | Admitting: *Deleted

## 2011-10-19 NOTE — Telephone Encounter (Signed)
Pt called in to report that he has not had a BM in 8 days. I told him to get in touch with his PCP, Dr. Lysbeth Galas, for advise and treatment for this condition.   While I was discussing this plan with the patient, His daughter walked in to our office. Darel Hong talked to her and told her to contact Dr. Lysbeth Galas for his GI needs. She voiced understanding to this plan.

## 2011-10-21 ENCOUNTER — Other Ambulatory Visit: Payer: Self-pay | Admitting: *Deleted

## 2011-10-21 DIAGNOSIS — I70219 Atherosclerosis of native arteries of extremities with intermittent claudication, unspecified extremity: Secondary | ICD-10-CM

## 2011-10-21 DIAGNOSIS — Z48812 Encounter for surgical aftercare following surgery on the circulatory system: Secondary | ICD-10-CM

## 2011-10-25 ENCOUNTER — Encounter: Payer: Self-pay | Admitting: Vascular Surgery

## 2011-10-25 ENCOUNTER — Encounter: Payer: Self-pay | Admitting: Neurosurgery

## 2011-10-26 ENCOUNTER — Ambulatory Visit: Payer: Self-pay | Admitting: Neurosurgery

## 2012-03-26 ENCOUNTER — Encounter: Payer: Self-pay | Admitting: Neurosurgery

## 2012-03-27 ENCOUNTER — Ambulatory Visit: Payer: Self-pay | Admitting: Neurosurgery

## 2012-05-11 ENCOUNTER — Encounter (HOSPITAL_COMMUNITY): Payer: Self-pay

## 2012-05-11 ENCOUNTER — Emergency Department (HOSPITAL_COMMUNITY): Payer: Medicare Other

## 2012-05-11 ENCOUNTER — Inpatient Hospital Stay (HOSPITAL_COMMUNITY)
Admission: EM | Admit: 2012-05-11 | Discharge: 2012-05-15 | DRG: 291 | Disposition: A | Discharge status: Nursing Home (Includes ICF) | Payer: Medicare Other | Attending: Internal Medicine | Admitting: Internal Medicine

## 2012-05-11 ENCOUNTER — Inpatient Hospital Stay (HOSPITAL_COMMUNITY): Payer: Medicare Other

## 2012-05-11 DIAGNOSIS — J9601 Acute respiratory failure with hypoxia: Secondary | ICD-10-CM | POA: Diagnosis present

## 2012-05-11 DIAGNOSIS — I10 Essential (primary) hypertension: Secondary | ICD-10-CM | POA: Diagnosis present

## 2012-05-11 DIAGNOSIS — F17200 Nicotine dependence, unspecified, uncomplicated: Secondary | ICD-10-CM | POA: Diagnosis present

## 2012-05-11 DIAGNOSIS — F0390 Unspecified dementia without behavioral disturbance: Secondary | ICD-10-CM | POA: Diagnosis present

## 2012-05-11 DIAGNOSIS — I509 Heart failure, unspecified: Secondary | ICD-10-CM | POA: Diagnosis present

## 2012-05-11 DIAGNOSIS — I739 Peripheral vascular disease, unspecified: Secondary | ICD-10-CM

## 2012-05-11 DIAGNOSIS — C3490 Malignant neoplasm of unspecified part of unspecified bronchus or lung: Secondary | ICD-10-CM | POA: Diagnosis present

## 2012-05-11 DIAGNOSIS — Z9221 Personal history of antineoplastic chemotherapy: Secondary | ICD-10-CM

## 2012-05-11 DIAGNOSIS — J90 Pleural effusion, not elsewhere classified: Secondary | ICD-10-CM | POA: Diagnosis present

## 2012-05-11 DIAGNOSIS — Z86718 Personal history of other venous thrombosis and embolism: Secondary | ICD-10-CM

## 2012-05-11 DIAGNOSIS — I25810 Atherosclerosis of coronary artery bypass graft(s) without angina pectoris: Secondary | ICD-10-CM

## 2012-05-11 DIAGNOSIS — E039 Hypothyroidism, unspecified: Secondary | ICD-10-CM | POA: Diagnosis present

## 2012-05-11 DIAGNOSIS — Z79899 Other long term (current) drug therapy: Secondary | ICD-10-CM

## 2012-05-11 DIAGNOSIS — Z66 Do not resuscitate: Secondary | ICD-10-CM | POA: Diagnosis present

## 2012-05-11 DIAGNOSIS — J9600 Acute respiratory failure, unspecified whether with hypoxia or hypercapnia: Secondary | ICD-10-CM | POA: Diagnosis present

## 2012-05-11 DIAGNOSIS — R0689 Other abnormalities of breathing: Secondary | ICD-10-CM | POA: Diagnosis present

## 2012-05-11 DIAGNOSIS — E785 Hyperlipidemia, unspecified: Secondary | ICD-10-CM | POA: Diagnosis present

## 2012-05-11 DIAGNOSIS — I5189 Other ill-defined heart diseases: Secondary | ICD-10-CM

## 2012-05-11 DIAGNOSIS — Z902 Acquired absence of lung [part of]: Secondary | ICD-10-CM

## 2012-05-11 DIAGNOSIS — Z951 Presence of aortocoronary bypass graft: Secondary | ICD-10-CM

## 2012-05-11 DIAGNOSIS — F341 Dysthymic disorder: Secondary | ICD-10-CM | POA: Diagnosis present

## 2012-05-11 DIAGNOSIS — I252 Old myocardial infarction: Secondary | ICD-10-CM

## 2012-05-11 DIAGNOSIS — I429 Cardiomyopathy, unspecified: Secondary | ICD-10-CM

## 2012-05-11 DIAGNOSIS — I5021 Acute systolic (congestive) heart failure: Principal | ICD-10-CM | POA: Diagnosis present

## 2012-05-11 DIAGNOSIS — Z85118 Personal history of other malignant neoplasm of bronchus and lung: Secondary | ICD-10-CM

## 2012-05-11 DIAGNOSIS — J189 Pneumonia, unspecified organism: Secondary | ICD-10-CM | POA: Diagnosis present

## 2012-05-11 DIAGNOSIS — I2510 Atherosclerotic heart disease of native coronary artery without angina pectoris: Secondary | ICD-10-CM | POA: Diagnosis present

## 2012-05-11 LAB — COMPREHENSIVE METABOLIC PANEL
ALT: 12 U/L (ref 0–53)
AST: 16 U/L (ref 0–37)
Alkaline Phosphatase: 125 U/L — ABNORMAL HIGH (ref 39–117)
CO2: 27 mEq/L (ref 19–32)
Chloride: 104 mEq/L (ref 96–112)
Creatinine, Ser: 1.41 mg/dL — ABNORMAL HIGH (ref 0.50–1.35)
GFR calc non Af Amer: 47 mL/min — ABNORMAL LOW (ref >90)
Total Bilirubin: 0.7 mg/dL (ref 0.3–1.2)

## 2012-05-11 LAB — BLOOD GAS, ARTERIAL
Acid-base deficit: 3.2 mmol/L — ABNORMAL HIGH (ref 0.0–2.0)
Bicarbonate: 20.7 mEq/L (ref 20.0–24.0)
O2 Content: 2 L/min
O2 Saturation: 98.8 %
TCO2: 18.2 mmol/L (ref 0–100)
pO2, Arterial: 103 mmHg — ABNORMAL HIGH (ref 80.0–100.0)

## 2012-05-11 LAB — APTT: aPTT: 38 seconds — ABNORMAL HIGH (ref 24–37)

## 2012-05-11 LAB — CBC WITH DIFFERENTIAL/PLATELET
Basophils Absolute: 0 10*3/uL (ref 0.0–0.1)
HCT: 43.9 % (ref 39.0–52.0)
Hemoglobin: 14.6 g/dL (ref 13.0–17.0)
Lymphocytes Relative: 28 % (ref 12–46)
Monocytes Absolute: 0.9 10*3/uL (ref 0.1–1.0)
Neutro Abs: 5.8 10*3/uL (ref 1.7–7.7)
RDW: 15.7 % — ABNORMAL HIGH (ref 11.5–15.5)
WBC: 9.6 10*3/uL (ref 4.0–10.5)

## 2012-05-11 LAB — D-DIMER, QUANTITATIVE: D-Dimer, Quant: 3.53 ug/mL-FEU — ABNORMAL HIGH (ref 0.00–0.48)

## 2012-05-11 MED ORDER — SODIUM CHLORIDE 0.9 % IV SOLN
INTRAVENOUS | Status: DC
  Administered 2012-05-11: 17:00:00 via INTRAVENOUS

## 2012-05-11 MED ORDER — POTASSIUM CHLORIDE IN NACL 20-0.9 MEQ/L-% IV SOLN
INTRAVENOUS | Status: DC
  Administered 2012-05-11 – 2012-05-14 (×5): via INTRAVENOUS

## 2012-05-11 MED ORDER — HYDROMORPHONE HCL PF 1 MG/ML IJ SOLN: 0.5000 mg | INTRAMUSCULAR | Status: DC | PRN

## 2012-05-11 MED ORDER — ALBUTEROL SULFATE (5 MG/ML) 0.5% IN NEBU: 2.5000 mg | INHALATION_SOLUTION | Freq: Four times a day (QID) | RESPIRATORY_TRACT | Status: DC | PRN

## 2012-05-11 MED ORDER — DEXTROSE 5 % IV SOLN
2.0000 g | Freq: Two times a day (BID) | INTRAVENOUS | Status: DC
  Administered 2012-05-12 – 2012-05-14 (×5): 2 g via INTRAVENOUS
  Filled 2012-05-11 (×7): qty 2

## 2012-05-11 MED ORDER — SORBITOL 70 % SOLN: 30.0000 mL | Freq: Every day | Status: DC | PRN

## 2012-05-11 MED ORDER — ACETAMINOPHEN 325 MG PO TABS
650.0000 mg | ORAL_TABLET | ORAL | Status: DC | PRN
  Administered 2012-05-13: 650 mg via ORAL
  Filled 2012-05-11: qty 2

## 2012-05-11 MED ORDER — ENOXAPARIN SODIUM 40 MG/0.4ML ~~LOC~~ SOLN
40.0000 mg | SUBCUTANEOUS | Status: DC
  Administered 2012-05-12 – 2012-05-15 (×4): 40 mg via SUBCUTANEOUS
  Filled 2012-05-11 (×5): qty 0.4

## 2012-05-11 MED ORDER — SODIUM CHLORIDE 0.9 % IJ SOLN
3.0000 mL | Freq: Two times a day (BID) | INTRAMUSCULAR | Status: DC
  Administered 2012-05-13: 3 mL via INTRAVENOUS
  Filled 2012-05-11: qty 3

## 2012-05-11 MED ORDER — FLEET ENEMA 7-19 GM/118ML RE ENEM: 1.0000 | ENEMA | Freq: Once | RECTAL | Status: AC | PRN

## 2012-05-11 MED ORDER — VANCOMYCIN HCL IN DEXTROSE 1-5 GM/200ML-% IV SOLN
1000.0000 mg | Freq: Two times a day (BID) | INTRAVENOUS | Status: DC
  Administered 2012-05-12 – 2012-05-14 (×5): 1000 mg via INTRAVENOUS
  Filled 2012-05-11 (×7): qty 200

## 2012-05-11 MED ORDER — LEVOFLOXACIN IN D5W 750 MG/150ML IV SOLN
750.0000 mg | INTRAVENOUS | Status: AC
  Administered 2012-05-11 – 2012-05-13 (×3): 750 mg via INTRAVENOUS
  Filled 2012-05-11 (×3): qty 150

## 2012-05-11 MED ORDER — DEXTROSE 5 % IV SOLN
2.0000 g | Freq: Once | INTRAVENOUS | Status: AC
  Administered 2012-05-11: 2 g via INTRAVENOUS
  Filled 2012-05-11: qty 2

## 2012-05-11 MED ORDER — SODIUM CHLORIDE 0.9 % IV SOLN
INTRAVENOUS | Status: AC
  Administered 2012-05-11: 19:00:00 via INTRAVENOUS

## 2012-05-11 MED ORDER — SODIUM CHLORIDE 0.9 % IV BOLUS (SEPSIS)
1000.0000 mL | Freq: Once | INTRAVENOUS | Status: AC
  Administered 2012-05-11: 1000 mL via INTRAVENOUS

## 2012-05-11 MED ORDER — ONDANSETRON HCL 4 MG/2ML IJ SOLN: 4.0000 mg | INTRAMUSCULAR | Status: DC | PRN

## 2012-05-11 MED ORDER — VANCOMYCIN HCL IN DEXTROSE 1-5 GM/200ML-% IV SOLN
1000.0000 mg | Freq: Once | INTRAVENOUS | Status: AC
  Administered 2012-05-11: 1000 mg via INTRAVENOUS
  Filled 2012-05-11: qty 200

## 2012-05-11 MED ORDER — SODIUM CHLORIDE 0.9 % IV BOLUS (SEPSIS)
250.0000 mL | Freq: Once | INTRAVENOUS | Status: AC
  Administered 2012-05-11: 250 mL via INTRAVENOUS

## 2012-05-11 MED ORDER — DEXTROSE 5 % IV SOLN: 1.0000 g | Freq: Three times a day (TID) | INTRAVENOUS | Status: DC

## 2012-05-11 MED ORDER — MIRTAZAPINE 30 MG PO TABS
15.0000 mg | ORAL_TABLET | Freq: Every evening | ORAL | Status: DC | PRN
  Administered 2012-05-12: 15 mg via ORAL
  Filled 2012-05-11: qty 1

## 2012-05-11 NOTE — ED Provider Notes (Signed)
History  This chart was scribed for Shelda Jakes, MD by Bennett Scrape, ED Scribe. This patient was seen in room APA09/APA09 and the patient's care was started at 3:44 PM.  CSN: [id number]  Arrival date & time 05/11/12  1450   First MD Initiated Contact with Patient 05/11/12 1544      Chief Complaint  Patient presents with  . Shortness of Breath    Patient is a 79 y.o. male presenting with shortness of breath. The history is provided by the patient. No language interpreter was used.  Shortness of Breath Severity:  Moderate Onset quality:  Gradual Timing:  Constant Progression:  Worsening Chronicity:  Chronic Relieved by:  Oxygen Worsened by:  Exertion Ineffective treatments:  None tried Associated symptoms: cough   Associated symptoms: no abdominal pain, no chest pain, no fever, no headaches, no neck pain, no rash, no sore throat and no vomiting     [name] [name] is a 79 y.o. male with a h/o lung CA who is not currently on chemotherapy brought in by ambulance from [facility], who presents to the Emergency Department complaining of chronic SOB that became gradually worse today. Pt reports that his symptom improved en route with a breathing treatment and with [place] oxygen in the ED. He reports that he has used [place] oxygen in the past but denies being on oxygen currently and denies that he has been for the past two months since moving to [facility]. He reports a PNA diagnosis 2 months ago but denies any recent CXR or recent antibiotic usage. He denies fever, chills, congestion, rhinorrhea, nausea and emesis as associated symptoms. He also has a h/o HTN, HLD and CAD. He is a current everyday smoker and occasional alcohol user.  Past Medical History  Diagnosis Date  . Hyperlipidemia   . Hypertension   . Myocardial infarction 2008  . DVT (deep venous thrombosis)   . Peripheral vascular disease   . Heart murmur   . Hiatal hernia   . Arthritis   . Depression with anxiety   .  Cancer     lung cancer  . CAD (coronary artery disease)     Past Surgical History  Procedure Laterality Date  . Pr vein bypass graft,aorto-fem-pop  09-15-2008    Left axillo bifemoral BPG by Dr. Liliane Bade  . Pr vein bypass graft,aorto-fem-pop  1999    Right Femoral- Popliteal BPG by Dr. Madilyn Fireman  . Lung lobectomy  1999    Left upper Lobectomy and mediastinal lymph node dissection  ( Dr. Horald Chestnut)    No family history on file.  History  Substance Use Topics  . Smoking status: Current Every Day Smoker    Types: Cigarettes  . Smokeless tobacco: Not on file  . Alcohol Use: Yes     Comment: 1 pint per day      Review of Systems  Constitutional: Negative for fever and chills.  HENT: Negative for congestion, sore throat, rhinorrhea and neck pain.   Eyes: Negative for visual disturbance.  Respiratory: Positive for cough and shortness of breath.   Cardiovascular: Negative for chest pain.  Gastrointestinal: Negative for nausea, vomiting, abdominal pain and diarrhea.  Genitourinary: Negative for dysuria and hematuria.  Musculoskeletal: Positive for back pain.  Skin: Negative for rash.  Neurological: Negative for headaches.  Hematological: Does not bruise/bleed easily.    Allergies  Review of patient's allergies indicates no known allergies.  Home Medications   Current Outpatient Rx  Name  Route  Sig  Dispense  Refill  . albuterol (PROAIR HFA) 108 (90 BASE) MCG/ACT inhaler   Inhalation   Inhale 1-2 puffs into the lungs every 4 (four) hours as needed for wheezing.         . mirtazapine (REMERON) 15 MG tablet   Oral   Take 15 mg by mouth at bedtime as needed (for sleep).           Triage Vitals: BP 114/77  Pulse 70  SpO2 100%  Physical Exam  Nursing note and vitals reviewed. Constitutional: He is oriented to person, place, and time. He appears well-developed and well-nourished. No distress.  HENT:  Head: Normocephalic and atraumatic.  Mouth/Throat:  Oropharynx is clear and moist.  Moist MM  Eyes: Conjunctivae and EOM are normal. Pupils are equal, round, and reactive to light.  Neck: Neck supple. No tracheal deviation present.  Cardiovascular: Normal rate and regular rhythm.  Exam reveals no gallop and no friction rub.   No murmur heard. Pulmonary/Chest: Effort normal and breath sounds normal. No respiratory distress. He has no wheezes. He has no rales.  Abdominal: Soft. Bowel sounds are normal. There is no tenderness.  Musculoskeletal: Normal range of motion. He exhibits no edema.  Neurological: He is alert and oriented to person, place, and time. No cranial nerve deficit.  Skin: Skin is warm and dry.  Psychiatric: He has a normal mood and affect. His behavior is normal.    ED Course  Procedures (including critical care time)  DIAGNOSTIC STUDIES: Oxygen Saturation is 100% on 2L [place], normal by my interpretation.    COORDINATION OF CARE: 4:00 PM-Discussed treatment plan which includes IV fluids, CXR, CBC panel, and antibiotics with pt at bedside and pt agreed to plan.   4:30 PM- Ordered 250 mL of bolus, 750 mg Levaquin IVPB and 2 g Maxipime IVPB  5:57 PM-Pt rechecked and oxygen stats are 80%-94% off of the [place]. Will seek admission with pt's approval.  6:19 PM-Consult complete with Dr. Karilyn Cota, hospitalist. Patient Foody explained and discussed. Dr. Karilyn Cota agrees to admit patient for further evaluation and treatment to team 1, regular bed. Call ended at 6:20 PM  Labs Reviewed  CBC WITH DIFFERENTIAL - Abnormal; Notable for the following:    RDW 15.7 (*)    Platelets 123 (*)    All other components within normal limits  COMPREHENSIVE METABOLIC PANEL - Abnormal; Notable for the following:    Glucose, Bld 114 (*)    Creatinine, Ser 1.41 (*)    Albumin 3.4 (*)    Alkaline Phosphatase 125 (*)    GFR calc non Af Amer 47 (*)    GFR calc Af Amer 54 (*)    All other components within normal limits  BLOOD GAS, ARTERIAL - Abnormal;  Notable for the following:    pCO2 arterial 33.0 (*)    pO2, Arterial 103.0 (*)    Acid-base deficit 3.2 (*)    All other components within normal limits  CULTURE, BLOOD (ROUTINE X 2)  CULTURE, BLOOD (ROUTINE X 2)  LIPASE, BLOOD    Dg Chest Portable 1 View  05/11/2012  *RADIOLOGY REPORT*  Clinical Data: Shortness of breath  PORTABLE CHEST - 1 VIEW  Comparison: 04/13/2012, 04/12/2012  Findings: Postop changes in the left hilum.  Patchy right hilar focal airspace opacity and worsening dense consolidation in the left lower lobe obscuring the left cardiac border.  Chronic pleural thickening versus effusion on the left.  No pneumothorax or edema pattern.  Degenerative changes of the spine.  IMPRESSION: Worsening patchy bibasilar airspace disease/consolidation. Pneumonia not excluded.  Chronic left pleural thickening versus pleural effusion  Postop changes left hilum  Recommend radiographic follow-up to document resolution   Original Report Authenticated By: Judie Petit. Miles Costain, M.D.    Results for orders placed during the hospital encounter of 05/11/12  CBC WITH DIFFERENTIAL      Result Value Range   WBC 9.6  4.0 - 10.5 K/uL   RBC 4.56  4.22 - 5.81 MIL/uL   Hemoglobin 14.6  13.0 - 17.0 g/dL   HCT 45.4  09.8 - 11.9 %   MCV 96.3  78.0 - 100.0 fL   MCH 32.0  26.0 - 34.0 pg   MCHC 33.3  30.0 - 36.0 g/dL   RDW 14.7 (*) 82.9 - 56.2 %   Platelets 123 (*) 150 - 400 K/uL   Neutrophils Relative 61  43 - 77 %   Neutro Abs 5.8  1.7 - 7.7 K/uL   Lymphocytes Relative 28  12 - 46 %   Lymphs Abs 2.7  0.7 - 4.0 K/uL   Monocytes Relative 10  3 - 12 %   Monocytes Absolute 0.9  0.1 - 1.0 K/uL   Eosinophils Relative 1  0 - 5 %   Eosinophils Absolute 0.1  0.0 - 0.7 K/uL   Basophils Relative 0  0 - 1 %   Basophils Absolute 0.0  0.0 - 0.1 K/uL  COMPREHENSIVE METABOLIC PANEL      Result Value Range   Sodium 141  135 - 145 mEq/L   Potassium 4.0  3.5 - 5.1 mEq/L   Chloride 104  96 - 112 mEq/L   CO2 27  19 - 32 mEq/L    Glucose, Bld 114 (*) 70 - 99 mg/dL   BUN 16  6 - 23 mg/dL   Creatinine, Ser 1.30 (*) 0.50 - 1.35 mg/dL   Calcium 9.1  8.4 - 86.5 mg/dL   Total Protein 6.4  6.0 - 8.3 g/dL   Albumin 3.4 (*) 3.5 - 5.2 g/dL   AST 16  0 - 37 U/L   ALT 12  0 - 53 U/L   Alkaline Phosphatase 125 (*) 39 - 117 U/L   Total Bilirubin 0.7  0.3 - 1.2 mg/dL   GFR calc non Af Amer 47 (*) >90 mL/min   GFR calc Af Amer 54 (*) >90 mL/min  LIPASE, BLOOD      Result Value Range   Lipase 25  11 - 59 U/L  BLOOD GAS, ARTERIAL      Result Value Range   O2 Content 2.0     Delivery systems NASAL CANNULA     pH, Arterial 7.414  7.350 - 7.450   pCO2 arterial 33.0 (*) 35.0 - 45.0 mmHg   pO2, Arterial 103.0 (*) 80.0 - 100.0 mmHg   Bicarbonate 20.7  20.0 - 24.0 mEq/L   TCO2 18.2  0 - 100 mmol/L   Acid-base deficit 3.2 (*) 0.0 - 2.0 mmol/L   O2 Saturation 98.8     Patient temperature 37.0     Collection site LEFT RADIAL     Drawn by COLLECTED BY RT     Sample type ARTERIAL     Allens test (pass/fail) PASS  PASS     1. HCAP (healthcare-associated pneumonia)   2. Lung cancer, unspecified laterality       MDM  The patient from nursing facility known to have lung cancer. Chest x-ray  today suggestive of a new focal opacities. Could be consolidation due to the lung CA or developing pneumonia. Patient on room air does desat down to 88%. On oxygen sats above 95%. Here blood gas on oxygen was good. No leukocytosis. Healthcare acquired pneumonia protocol put in place along with the antibiotics. Patient will be admitted by hospitalist team to MedSurg bed.      I personally performed the services described in this documentation, which was scribed in my presence. The recorded information has been reviewed and is accurate.     Shelda Jakes, MD 05/11/12 [phone]

## 2012-05-11 NOTE — H&P (Signed)
[facility] History and Physical  Mccoy Testa Lysne  MWU:[id number]  DOB: 1933/04/02   DOA: 05/11/2012   PCP:   Josue Hector, MD   Chief Complaint:  Shortness of breath   HPI: [name] [name] is an 79 y.o. male.   Elderly Caucasian gentleman from [facility] who is brought to the emergency room with a history of shortness of breath. The patient is too demented to give a personal history to me; although calm, and not ill-looking, he is oriented currently only to himself, he gives the year as 1914, and is sometimes unclear as to whether he lives at a nursing home or at home. He states that she has cancer, but is unable to give details.  Several phone numbers listed in the chart, for his daughter and home, are out of order when called.  On the records he was brought  to the emergency room because of shortness of breath and some coughing and a history of lung cancer. The records show that he had lung cancer surgery sometime before 1999.   Records indicate history of extensive vascular bypass surgery, left axillary to femoral  fem-fem bypass and right fem-pop bypas,  for peripheral vascular disease, and heavy alcohol and tobacco use history.  There is no history of fever chills or hemoptysis. We are unable to get a history of weight loss. A chest x-ray was done which was abnormal. He was noted to be saturating at 84% on room air; it is not clear if he uses home oxygen at home. He was felt to have healthcare associated pneumonia and the hospitalist service was called for admission.  Rewiew of Systems:   Unable to attain because of patient's mental status  Past Medical History  Diagnosis Date  . Hyperlipidemia   . Hypertension   . Myocardial infarction 2008  . DVT (deep venous thrombosis)   . Peripheral vascular disease   . Heart murmur   . Hiatal hernia   . Arthritis   . Depression with anxiety   . Cancer     lung cancer  . CAD (coronary artery disease)      Past Surgical History  Procedure Laterality Date  . Pr vein bypass graft,aorto-fem-pop  09-15-2008    Left axillo bifemoral BPG by Dr. Liliane Bade  . Pr vein bypass graft,aorto-fem-pop  1999    Right Femoral- Popliteal BPG by Dr. Madilyn Fireman  . Lung lobectomy  1999    Left upper Lobectomy and mediastinal lymph node dissection  ( Dr. Horald Chestnut)    Medications:  HOME MEDS: Prior to Admission medications   Medication Sig Start Date End Date Taking? Authorizing Treyden Hakim  albuterol (PROAIR HFA) 108 (90 BASE) MCG/ACT inhaler Inhale 1-2 puffs into the lungs every 4 (four) hours as needed for wheezing.   Yes Historical Daiton Cowles, MD  mirtazapine (REMERON) 15 MG tablet Take 15 mg by mouth at bedtime as needed (for sleep).   Yes Historical Fedrick Cefalu, MD     Allergies:  No Known Allergies  Social History:   reports that he has been smoking Cigarettes.  He has been smoking about 0.00 packs per day. He does not have any smokeless tobacco history on file. He reports that  drinks alcohol. His drug history is not on file.  Family History: No family history on file. Unable to obtain because of patient's mental status  Physical Exam: Filed Vitals:   05/11/12 1800 05/11/12 1918 05/11/12 2111 05/11/12 2133  BP: 113/67 113/67 91/59  Pulse: 128 97 97 97  Temp:  98.1 F (36.7 C) 97.5 F (36.4 C)   TempSrc:  Oral Axillary   Resp: 28 24 20 20   Height:  6' (1.829 m) 6' (1.829 m)   Weight:  76.658 kg (169 lb) 74.3 kg (163 lb 12.8 oz)   SpO2: 96% 100% 100% 97%   Blood pressure 91/59, pulse 97, temperature 97.5 F (36.4 C), temperature source Axillary, resp. rate 20, height 6' (1.829 m), weight 74.3 kg (163 lb 12.8 oz), SpO2 97.00%.  GEN:  Pleasantly demented, nontoxic appearing elderly Caucasian gentleman lying in bed in no acute distress; cooperative with exam PSYCH:  alert ; does not appear anxious or depressed; affect is appropriate. HEENT: Mucous membranes pink and anicteric; PERRLA; EOM  intact; nor thyromegaly or carotid bruit; no JVD; Breasts:: Not examined CHEST WALL: No tenderness CHEST: Normal respiration, clear to auscultation bilaterally HEART: Regular rate and rhythm; no murmurs rubs or gallops BACK: No kyphosis or scoliosis; no CVA tenderness ABDOMEN:  soft non-tender; no masses, no organomegaly, normal abdominal bowel sounds; no pannus; no intertriginous candida. Rectal Exam: Not done EXTREMITIES: ; age-appropriate arthropathy of the hands and knees; 2+ edema right lower extremity; no edema left lower extremity;no ulcerations. Genitalia: not examined PULSES: 2+ and symmetric SKIN: Normal hydration no rash or ulceration CNS: Cranial nerves 2-12 grossly intact no focal lateralizing neurologic deficit   Labs on Admission:  Basic Metabolic Panel:  Recent Labs Lab 05/11/12 1618  NA 141  K 4.0  CL 104  CO2 27  GLUCOSE 114*  BUN 16  CREATININE 1.41*  CALCIUM 9.1   Liver Function Tests:  Recent Labs Lab 05/11/12 1618  AST 16  ALT 12  ALKPHOS 125*  BILITOT 0.7  PROT 6.4  ALBUMIN 3.4*    Recent Labs Lab 05/11/12 1618  LIPASE 25   No results found for this basename: AMMONIA,  in the last 168 hours CBC:  Recent Labs Lab 05/11/12 1618  WBC 9.6  NEUTROABS 5.8  HGB 14.6  HCT 43.9  MCV 96.3  PLT 123*   Cardiac Enzymes: No results found for this basename: CKTOTAL, CKMB, CKMBINDEX, TROPONINI,  in the last 168 hours BNP: No components found with this basename: POCBNP,  D-dimer: No components found with this basename: D-DIMER,  CBG: No results found for this basename: GLUCAP,  in the last 168 hours  Radiological Exams on Admission: Dg Chest Portable 1 View  05/11/2012  *RADIOLOGY REPORT*  Clinical Data: Shortness of breath  PORTABLE CHEST - 1 VIEW  Comparison: 04/13/2012, 04/12/2012  Findings: Postop changes in the left hilum.  Patchy right hilar focal airspace opacity and worsening dense consolidation in the left lower lobe obscuring the  left cardiac border.  Chronic pleural thickening versus effusion on the left.  No pneumothorax or edema pattern.  Degenerative changes of the spine.  IMPRESSION: Worsening patchy bibasilar airspace disease/consolidation. Pneumonia not excluded.  Chronic left pleural thickening versus pleural effusion  Postop changes left hilum  Recommend radiographic follow-up to document resolution   Original Report Authenticated By: Judie Petit. Miles Costain, M.D.       Assessment/Plan Present on Admission:  . Difficulty breathing . Acute respiratory failure with hypoxia . Dementia Vascular path Right leg edema   PLAN: Since his predominant complaint is shortness of breath, and the predominant finding is hypoxia and abnormal chest x-ray, we will get a CT scan of his chest. If his d-dimer is positive will make it a CT angiogram.  His  absence of fever and leukocytosis and nontoxic appearance, is not convincing for pneumonia  A definitive plans depending on the results of the CT.   Other plans as per orders.  Code Status: FULL CODE  Family Communication: No family available for discussion; gentleman seems to have significant dementia  Disposition Plan: Depending on results of CT, and discussion with family when available    CAMPBELL,LEOPOLD Nocturnist [facility] Pager [phone]   05/11/2012, 11:11 PM

## 2012-05-11 NOTE — ED Notes (Signed)
Pt states feels ok, pt informed awaiting on bed assignment.

## 2012-05-11 NOTE — ED Notes (Signed)
Pt here from [facility] for evaluation of SOB. Pt has lung CA and is always SOB. Pt wheezing on arrival to ED.

## 2012-05-11 NOTE — ED Notes (Signed)
Spoke w/ Bree from floor & states she has beds pt not showing up for her to assign. Bed controlled called for a second time awaiting bed assignment.

## 2012-05-11 NOTE — Progress Notes (Signed)
ANTIBIOTIC CONSULT NOTE - INITIAL  Pharmacy Consult for Vancomycin, Cefepime, Levaquin Indication: pneumonia  No Known Allergies  Patient Measurements: Height: 6' (182.9 cm) Weight: 169 lb (76.658 kg) IBW/kg (Calculated) : 77.6  Vital Signs: Temp: 98.1 F (36.7 C) (03/07 1918) Temp src: Oral (03/07 1918) BP: 113/67 mmHg (03/07 1918) Pulse Rate: 97 (03/07 1918) Intake/Output from previous day:   Intake/Output from this shift:    Labs:  Recent Labs  05/11/12 1618  WBC 9.6  HGB 14.6  PLT 123*  CREATININE 1.41*   Estimated Creatinine Clearance: 48.4 ml/min (by C-G formula based on Cr of 1.41). No results found for this basename: VANCOTROUGH, VANCOPEAK, VANCORANDOM, GENTTROUGH, GENTPEAK, GENTRANDOM, TOBRATROUGH, TOBRAPEAK, TOBRARND, AMIKACINPEAK, AMIKACINTROU, AMIKACIN,  in the last 72 hours   Microbiology: No results found for this or any previous visit (from the past 720 hour(s)).  Medical History: Past Medical History  Diagnosis Date  . Hyperlipidemia   . Hypertension   . Myocardial infarction 2008  . DVT (deep venous thrombosis)   . Peripheral vascular disease   . Heart murmur   . Hiatal hernia   . Arthritis   . Depression with anxiety   . Cancer     lung cancer  . CAD (coronary artery disease)     Medications:  Scheduled:    Assessment: 79 yo M with hx lung CA presents from nursing facility with shortness of breath & new opacities on CXR.  Starting empiric, broad-spectrum antibiotics for HCAP.    His renal function is borderline for renal dose adjustments.  Unclear if this is acutely elevated or patient's baseline (last known Scr in Jan 2011 was 0.83).  Goal of Therapy:  Vancomycin trough level 15-20 mcg/ml  Plan:  1) Levaquin 750mg  IV Q24h 2) Cefepime 2gm IV Q12h 3) Vancomycin 1gm IV Q12h 4) Check Vancomycin trough at steady state 5) Monitor renal function and cx data  6) Duration of therapy per MD  Elson Clan 05/11/2012,8:06  PM

## 2012-05-12 DIAGNOSIS — C3490 Malignant neoplasm of unspecified part of unspecified bronchus or lung: Secondary | ICD-10-CM

## 2012-05-12 DIAGNOSIS — J189 Pneumonia, unspecified organism: Secondary | ICD-10-CM

## 2012-05-12 DIAGNOSIS — F0390 Unspecified dementia without behavioral disturbance: Secondary | ICD-10-CM

## 2012-05-12 LAB — CBC
MCH: 32 pg (ref 26.0–34.0)
MCV: 97 fL (ref 78.0–100.0)
Platelets: 112 10*3/uL — ABNORMAL LOW (ref 150–400)
RBC: 4.31 MIL/uL (ref 4.22–5.81)
RDW: 15.5 % (ref 11.5–15.5)
WBC: 7 10*3/uL (ref 4.0–10.5)

## 2012-05-12 LAB — URINALYSIS, ROUTINE W REFLEX MICROSCOPIC
Bilirubin Urine: NEGATIVE
Nitrite: NEGATIVE
Protein, ur: 30 mg/dL — AB
Specific Gravity, Urine: 1.015 (ref 1.005–1.030)
Urobilinogen, UA: 0.2 mg/dL (ref 0.0–1.0)

## 2012-05-12 LAB — URINE MICROSCOPIC-ADD ON

## 2012-05-12 LAB — BASIC METABOLIC PANEL
Calcium: 8.5 mg/dL (ref 8.4–10.5)
Creatinine, Ser: 1.28 mg/dL (ref 0.50–1.35)
GFR calc Af Amer: 61 mL/min — ABNORMAL LOW (ref >90)
Sodium: 141 mEq/L (ref 135–145)

## 2012-05-12 MED ORDER — DEXTROSE 5 % IV SOLN
INTRAVENOUS | Status: AC
  Filled 2012-05-12: qty 2

## 2012-05-12 MED ORDER — IOHEXOL 350 MG/ML SOLN
100.0000 mL | Freq: Once | INTRAVENOUS | Status: AC | PRN
  Administered 2012-05-12: 100 mL via INTRAVENOUS

## 2012-05-12 MED ORDER — NICOTINE 14 MG/24HR TD PT24
14.0000 mg | MEDICATED_PATCH | Freq: Every day | TRANSDERMAL | Status: DC
  Administered 2012-05-12 – 2012-05-15 (×4): 14 mg via TRANSDERMAL
  Filled 2012-05-12 (×4): qty 1

## 2012-05-12 MED ORDER — VANCOMYCIN HCL IN DEXTROSE 1-5 GM/200ML-% IV SOLN
INTRAVENOUS | Status: AC
  Filled 2012-05-12: qty 200

## 2012-05-12 NOTE — Progress Notes (Signed)
Subjective: This man was admitted yesterday with dyspnea. He is found to have a moderate-sized left pleural effusion as well as a mass in the right lung, consistent with a previous diagnosis of lung cancer. There is also associated atelectasis in this area. The patient himself cannot give me a very clear history, he apparently has a history of dementia.           Physical Exam: Blood pressure 137/69, pulse 89, temperature 97.6 F (36.4 C), temperature source Oral, resp. rate 20, height 6' (1.829 m), weight 74.3 kg (163 lb 12.8 oz), SpO2 96.00%. Surprisingly, he looks systemically well, slightly frail. There is no supraclavicular lymphadenopathy. Lung fields show reduced air entry in the left mid and lower zones and some dullness to percussion, consistent with a pleural effusion. Heart sounds are present and normal without murmurs or gallop rhythm. He does appear to be alert and orientated.   Investigations:     Basic Metabolic Panel:  Recent Labs  16/10/96 1618 05/12/12 0539  NA 141 141  K 4.0 3.8  CL 104 106  CO2 27 24  GLUCOSE 114* 90  BUN 16 15  CREATININE 1.41* 1.28  CALCIUM 9.1 8.5  MG  --  1.8   Liver Function Tests:  Recent Labs  05/11/12 1618  AST 16  ALT 12  ALKPHOS 125*  BILITOT 0.7  PROT 6.4  ALBUMIN 3.4*     CBC:  Recent Labs  05/11/12 1618 05/12/12 0539  WBC 9.6 7.0  NEUTROABS 5.8  --   HGB 14.6 13.8  HCT 43.9 41.8  MCV 96.3 97.0  PLT 123* 112*    Ct Angio Chest Pe W/cm &/or Wo Cm  05/12/2012  *RADIOLOGY REPORT*  Clinical Data: Shortness of breath and wheezing; known lung cancer.  CT ANGIOGRAPHY CHEST  Technique:  Multidetector CT imaging of the chest using the standard protocol during bolus administration of intravenous contrast. Multiplanar reconstructed images including MIPs were obtained and reviewed to evaluate the vascular anatomy.  Contrast: OMNIPAQUE IOHEXOL 350 MG/ML SOLN  Comparison: Chest radiograph performed  05/11/2012  Findings: There is no evidence of pulmonary embolus.  A 5.7 x 3.0 cm mass is noted extending between the right middle and lower lung lobes, compatible with malignancy.  Surrounding atelectasis is seen.  Small right and moderate left pleural effusions are noted; the left- sided pleural effusion is somewhat loculated in appearance.  There is irregular thickening of the left major fissure, with some associated nodularity; metastatic disease cannot be excluded.  No definite additional focal pulmonary nodules are identified.  No pneumothorax is seen.  Evaluation is mildly suboptimal due to motion artifact.  Borderline prominent right hilar nodes are seen, measuring up to 1.0 cm in short axis.  No definite mediastinal lymphadenopathy is appreciated.  Diffuse coronary artery calcifications are seen.  No significant pericardial effusion is identified.  Scattered calcific atherosclerotic disease is noted along the aortic arch and descending thoracic aorta.  Calcific atherosclerotic disease is noted along the great vessels; the great vessels are otherwise grossly unremarkable in appearance.  No axillary lymphadenopathy is seen.  The thyroid gland is unremarkable in appearance.  A vascular stent graft is noted along the left chest wall.  The visualized portions of the liver and spleen are unremarkable. Mildly increased attenuation within the contracted gallbladder could reflect stones; the gallbladder is otherwise grossly unremarkable.  Significant mural thrombus is seen along the abdominal aorta at and distal to the renal arteries, resulting  in severe aortic stenosis. Diffuse calcification is noted along the visualized renal arteries bilaterally.  Nonspecific perinephric stranding is seen bilaterally.  No acute osseous abnormalities are seen.  There is chronic deformity of the left 5th posterolateral rib, reflecting remote injury.  IMPRESSION:  1.  No evidence of pulmonary embolus. 2.  5.7 x 3.0 cm mass extending  between the right middle and lower lung lobes, compatible with malignancy.  Surrounding atelectasis noted. 3.  Small right and moderate left pleural effusion; the left-sided pleural effusion is somewhat loculated.  Irregular thickening along the left major fissure, with associated nodularity; underlying metastatic disease cannot be excluded. 4.  Borderline prominent right hilar nodes, measuring up to 1.0 cm in short axis; no definite mediastinal lymphadenopathy seen. 5.  Diffuse coronary artery calcifications seen. 6.  Question of cholelithiasis; gallbladder contracted and otherwise grossly unremarkable. 7.  Large amount of mural thrombus along the abdominal aorta, at and distal to the renal arteries, resulting in severe aortic stenosis.  Would correlate clinically for associated symptoms, and consider further evaluation as deemed clinically appropriate. Diffuse calcification along the renal arteries bilaterally.   Original Report Authenticated By: Tonia Ghent, M.D.    Dg Chest Portable 1 View  05/11/2012  *RADIOLOGY REPORT*  Clinical Data: Shortness of breath  PORTABLE CHEST - 1 VIEW  Comparison: 04/13/2012, 04/12/2012  Findings: Postop changes in the left hilum.  Patchy right hilar focal airspace opacity and worsening dense consolidation in the left lower lobe obscuring the left cardiac border.  Chronic pleural thickening versus effusion on the left.  No pneumothorax or edema pattern.  Degenerative changes of the spine.  IMPRESSION: Worsening patchy bibasilar airspace disease/consolidation. Pneumonia not excluded.  Chronic left pleural thickening versus pleural effusion  Postop changes left hilum  Recommend radiographic follow-up to document resolution   Original Report Authenticated By: Judie Petit. Miles Costain, M.D.       Medications: I have reviewed the patient's current medications.  Impression: 1. Left moderate sized pleural effusion. 2. COPD. 3. Lung cancer right side, details are not clear. 4. Apparent  dementia.     Plan: 1. Thoracentesis of the left pleural effusion, we will send the fluid off for cytology in particular. 2. I tried to reach any family member that can give me more information or who can help make decisions, unfortunately this has been unsuccessful.     LOS: 1 day   Wilson Singer Pager [phone]  05/12/2012, 8:34 AM

## 2012-05-13 DIAGNOSIS — R0609 Other forms of dyspnea: Secondary | ICD-10-CM

## 2012-05-13 LAB — CBC
MCH: 31.6 pg (ref 26.0–34.0)
MCHC: 32.6 g/dL (ref 30.0–36.0)
MCV: 96.9 fL (ref 78.0–100.0)
Platelets: 112 10*3/uL — ABNORMAL LOW (ref 150–400)
RDW: 15.6 % — ABNORMAL HIGH (ref 11.5–15.5)

## 2012-05-13 LAB — HEMOGLOBIN A1C
Hgb A1c MFr Bld: 5.2 % (ref <5.7)
Mean Plasma Glucose: 103 mg/dL (ref <117)

## 2012-05-13 LAB — COMPREHENSIVE METABOLIC PANEL
AST: 16 U/L (ref 0–37)
Albumin: 2.9 g/dL — ABNORMAL LOW (ref 3.5–5.2)
Calcium: 8.6 mg/dL (ref 8.4–10.5)
Creatinine, Ser: 1.41 mg/dL — ABNORMAL HIGH (ref 0.50–1.35)
GFR calc non Af Amer: 47 mL/min — ABNORMAL LOW (ref >90)
Sodium: 137 mEq/L (ref 135–145)
Total Protein: 5.9 g/dL — ABNORMAL LOW (ref 6.0–8.3)

## 2012-05-13 MED ORDER — CEFEPIME HCL 2 G IJ SOLR
INTRAMUSCULAR | Status: AC
  Filled 2012-05-13 (×2): qty 2

## 2012-05-13 NOTE — Progress Notes (Signed)
Subjective: This man was admitted  with dyspnea. He is found to have a moderate-sized left pleural effusion as well as a mass in the right lung, consistent with a previous diagnosis of lung cancer. There is also associated atelectasis in this area. The patient himself cannot give me a very clear history, he apparently has a history of dementia. Today he says he feels somewhat better. His breathing is easier. He has not had thoracentesis yet.           Physical Exam: Blood pressure 112/78, pulse 102, temperature 97.7 F (36.5 C), temperature source Oral, resp. rate 20, height 6' (1.829 m), weight 76.658 kg (169 lb), SpO2 98.00%. Surprisingly, he looks systemically well, slightly frail. There is no supraclavicular lymphadenopathy. Lung fields show reduced air entry in the left mid and lower zones and some dullness to percussion, consistent with a pleural effusion. Heart sounds are present and normal without murmurs or gallop rhythm. He also does have peripheral pitting edema in his legs. He does appear to be alert and orientated.   Investigations:     Basic Metabolic Panel:  Recent Labs  16/10/96 1618 05/12/12 0539 05/13/12 0528  NA 141 141 137  K 4.0 3.8 3.9  CL 104 106 104  CO2 27 24 22   GLUCOSE 114* 90 110*  BUN 16 15 19   CREATININE 1.41* 1.28 1.41*  CALCIUM 9.1 8.5 8.6  MG  --  1.8  --    Liver Function Tests:  Recent Labs  05/11/12 1618 05/13/12 0528  AST 16 16  ALT 12 12  ALKPHOS 125* 109  BILITOT 0.7 0.5  PROT 6.4 5.9*  ALBUMIN 3.4* 2.9*     CBC:  Recent Labs  05/11/12 1618 05/12/12 0539 05/13/12 0528  WBC 9.6 7.0 9.0  NEUTROABS 5.8  --   --   HGB 14.6 13.8 14.2  HCT 43.9 41.8 43.6  MCV 96.3 97.0 96.9  PLT 123* 112* 112*    Ct Angio Chest Pe W/cm &/or Wo Cm  05/12/2012  *RADIOLOGY REPORT*  Clinical Data: Shortness of breath and wheezing; known lung cancer.  CT ANGIOGRAPHY CHEST  Technique:  Multidetector CT imaging of the chest using  the standard protocol during bolus administration of intravenous contrast. Multiplanar reconstructed images including MIPs were obtained and reviewed to evaluate the vascular anatomy.  Contrast: OMNIPAQUE IOHEXOL 350 MG/ML SOLN  Comparison: Chest radiograph performed 05/11/2012  Findings: There is no evidence of pulmonary embolus.  A 5.7 x 3.0 cm mass is noted extending between the right middle and lower lung lobes, compatible with malignancy.  Surrounding atelectasis is seen.  Small right and moderate left pleural effusions are noted; the left- sided pleural effusion is somewhat loculated in appearance.  There is irregular thickening of the left major fissure, with some associated nodularity; metastatic disease cannot be excluded.  No definite additional focal pulmonary nodules are identified.  No pneumothorax is seen.  Evaluation is mildly suboptimal due to motion artifact.  Borderline prominent right hilar nodes are seen, measuring up to 1.0 cm in short axis.  No definite mediastinal lymphadenopathy is appreciated.  Diffuse coronary artery calcifications are seen.  No significant pericardial effusion is identified.  Scattered calcific atherosclerotic disease is noted along the aortic arch and descending thoracic aorta.  Calcific atherosclerotic disease is noted along the great vessels; the great vessels are otherwise grossly unremarkable in appearance.  No axillary lymphadenopathy is seen.  The thyroid gland is unremarkable in appearance.  A vascular stent graft is noted along the left chest wall.  The visualized portions of the liver and spleen are unremarkable. Mildly increased attenuation within the contracted gallbladder could reflect stones; the gallbladder is otherwise grossly unremarkable.  Significant mural thrombus is seen along the abdominal aorta at and distal to the renal arteries, resulting in severe aortic stenosis. Diffuse calcification is noted along the visualized renal arteries  bilaterally.  Nonspecific perinephric stranding is seen bilaterally.  No acute osseous abnormalities are seen.  There is chronic deformity of the left 5th posterolateral rib, reflecting remote injury.  IMPRESSION:  1.  No evidence of pulmonary embolus. 2.  5.7 x 3.0 cm mass extending between the right middle and lower lung lobes, compatible with malignancy.  Surrounding atelectasis noted. 3.  Small right and moderate left pleural effusion; the left-sided pleural effusion is somewhat loculated.  Irregular thickening along the left major fissure, with associated nodularity; underlying metastatic disease cannot be excluded. 4.  Borderline prominent right hilar nodes, measuring up to 1.0 cm in short axis; no definite mediastinal lymphadenopathy seen. 5.  Diffuse coronary artery calcifications seen. 6.  Question of cholelithiasis; gallbladder contracted and otherwise grossly unremarkable. 7.  Large amount of mural thrombus along the abdominal aorta, at and distal to the renal arteries, resulting in severe aortic stenosis.  Would correlate clinically for associated symptoms, and consider further evaluation as deemed clinically appropriate. Diffuse calcification along the renal arteries bilaterally.   Original Report Authenticated By: Tonia Ghent, M.D.    Dg Chest Portable 1 View  05/11/2012  *RADIOLOGY REPORT*  Clinical Data: Shortness of breath  PORTABLE CHEST - 1 VIEW  Comparison: 04/13/2012, 04/12/2012  Findings: Postop changes in the left hilum.  Patchy right hilar focal airspace opacity and worsening dense consolidation in the left lower lobe obscuring the left cardiac border.  Chronic pleural thickening versus effusion on the left.  No pneumothorax or edema pattern.  Degenerative changes of the spine.  IMPRESSION: Worsening patchy bibasilar airspace disease/consolidation. Pneumonia not excluded.  Chronic left pleural thickening versus pleural effusion  Postop changes left hilum  Recommend radiographic follow-up  to document resolution   Original Report Authenticated By: Judie Petit. Miles Costain, M.D.       Medications: I have reviewed the patient's current medications.  Impression: 1. Left moderate sized pleural effusion, etiology unclear. There is a likelihood that this is malignant, however this could be a manifestation of congestive heart failure also. 2. COPD. 3. Lung cancer right side, details are not clear. 4. Apparent dementia. 5. Worsening renal function.     Plan: 1. Thoracentesis of the left pleural effusion, we will send the fluid off for cytology in particular. 2. I tried to reach any family member that can give me more information or who can help make decisions, unfortunately this has been unsuccessful. 3. Echocardiogram to look at cardiac function. 4. Increase IV fluid rate.     LOS: 2 days   Wilson Singer Pager [phone]  05/13/2012, 8:45 AM

## 2012-05-14 ENCOUNTER — Inpatient Hospital Stay (HOSPITAL_COMMUNITY): Payer: Medicare Other

## 2012-05-14 DIAGNOSIS — I059 Rheumatic mitral valve disease, unspecified: Secondary | ICD-10-CM

## 2012-05-14 LAB — BASIC METABOLIC PANEL
BUN: 18 mg/dL (ref 6–23)
CO2: 22 mEq/L (ref 19–32)
Chloride: 110 mEq/L (ref 96–112)
GFR calc Af Amer: 57 mL/min — ABNORMAL LOW (ref >90)
Potassium: 4.2 mEq/L (ref 3.5–5.1)

## 2012-05-14 LAB — BODY FLUID CELL COUNT WITH DIFFERENTIAL
Eos, Fluid: 0 %
Monocyte-Macrophage-Serous Fluid: 36 % — ABNORMAL LOW (ref 50–90)
Neutrophil Count, Fluid: 44 % — ABNORMAL HIGH (ref 0–25)
Total Nucleated Cell Count, Fluid: 168 cu mm (ref 0–1000)

## 2012-05-14 LAB — GLUCOSE, SEROUS FLUID

## 2012-05-14 LAB — TSH: TSH: 6.893 u[IU]/mL — ABNORMAL HIGH (ref 0.350–4.500)

## 2012-05-14 MED ORDER — LISINOPRIL 5 MG PO TABS
5.0000 mg | ORAL_TABLET | Freq: Every day | ORAL | Status: DC
  Filled 2012-05-14: qty 1

## 2012-05-14 MED ORDER — LEVOFLOXACIN 750 MG PO TABS
750.0000 mg | ORAL_TABLET | Freq: Every day | ORAL | Status: DC
  Administered 2012-05-14 – 2012-05-15 (×2): 750 mg via ORAL
  Filled 2012-05-14 (×2): qty 1

## 2012-05-14 NOTE — Clinical Social Work Psychosocial (Signed)
    Clinical Social Work Department BRIEF PSYCHOSOCIAL ASSESSMENT 05/14/2012  Patient:  [name] [name], [name] [name]     Account Number:  [id number]     Admit date:  05/11/2012  Clinical Social Worker:  Santa Genera, CLINICAL SOCIAL WORKER  Date/Time:  05/14/2012 09:00 AM  Referred by:  Physician  Date Referred:  05/14/2012 Referred for  ALF Placement   Other Referral:   Interview type:  Patient Other interview type:   Also spoke w family, sister Teresa Coombs    PSYCHOSOCIAL DATA Living Status:  FACILITY Admitted from facility:  [facility] Level of care:  Assisted Living Primary support name:  Teresa Coombs Primary support relationship to patient:  SIBLING Degree of support available:   Limited family support but patient lives at [facility]    CURRENT CONCERNS Current Concerns  Post-Acute Placement   Other Concerns:    SOCIAL WORK ASSESSMENT / PLAN CSW spoke w patient at bedside, patient alert, oriented to self.  Unclear patient knew where he was, patient confused on details of his current living situation. Per patient, he lives at "[facility]" and has been there a few weeks. Says he was living at a halfway house in [place], but had to leave because "there was something going on there." Patient unable to give details of family contacts, but did mention that he  has three children living in [place], [place], [place] and [place].  Mentioned a sister, Thurston [place], in [place].  Wanted CSW to contact family but could only remember his phone number.    CSW called contact numbers on face sheet.  Daughter's phone not in service, sister Fannie Knee Bullins) answered.  Per sister, patient has been "in and out of prison his whole life" and has had significant problems w alcohol dependence.  Patient has several children, but has not been in contact w them for a long time.  Patient was divorced many years ago as well.  Per sister, patient was living in apartment in [place] (not a halfway house)  until approx one month ago when he was hospitalized for breathing issues.  DSS became involved due to concerns about his living conditions and patient was placed at [facility].  Confirmed this w Mahala Menghini, [facility]PS supervisor.  APS assisted w placement, patient voluntarily agreed to enter ALF and seemed pleased w arrangement.  They have closed the [facility] and had no concerns about patient signing himself in to ALF at that time.    CSW attempted to contact [facility], but was unable to do so.  Left VM requesting call back.  Will continue to try to reach facility.   Assessment/plan status:  Psychosocial Support/Ongoing Assessment of Needs Other assessment/ plan:   Information/referral to community resources:   None needed at this time.    PATIENT'S/FAMILY'S RESPONSE TO PLAN OF CARE: Family interested in patient condition, but unable to provide much assistance at this time.      Santa Genera, LCSW Clinical Social Worker [phone])

## 2012-05-14 NOTE — Progress Notes (Signed)
Lidocaine 1%           2mL injected                        pleural fluid removed

## 2012-05-14 NOTE — Progress Notes (Signed)
Nurse notified by central telemetry that pt was in afib.  Dr. Karilyn Cota notified via text page.  Returned page.  No new orders at this time.  Cardiology to see patient for consult.

## 2012-05-14 NOTE — Progress Notes (Signed)
ANTIBIOTIC CONSULT NOTE  Pharmacy Consult for Vancomycin & Cefepime Indication: pneumonia  No Known Allergies  Patient Measurements: Height: 6' (182.9 cm) Weight: 170 lb (77.111 kg) IBW/kg (Calculated) : 77.6  Vital Signs: Temp: 98 F (36.7 C) (03/10 0300) Temp src: Oral (03/10 0300) BP: 130/75 mmHg (03/10 0300) Pulse Rate: 100 (03/10 0300) Intake/Output from previous day: 03/09 0701 - 03/10 0700 In: 1323 [P.O.:480; I.V.:693; IV Piggyback:150] Out: 250 [Urine:250] Intake/Output from this shift: Total I/O In: 240 [P.O.:240] Out: -   Labs:  Recent Labs  05/11/12 1618 05/12/12 0539 05/13/12 0528 05/14/12 0515  WBC 9.6 7.0 9.0  --   HGB 14.6 13.8 14.2  --   PLT 123* 112* 112*  --   CREATININE 1.41* 1.28 1.41* 1.35   Estimated Creatinine Clearance: 50.8 ml/min (by C-G formula based on Cr of 1.35). No results found for this basename: VANCOTROUGH, Leodis Binet, VANCORANDOM, GENTTROUGH, GENTPEAK, GENTRANDOM, TOBRATROUGH, TOBRAPEAK, TOBRARND, AMIKACINPEAK, AMIKACINTROU, AMIKACIN,  in the last 72 hours   Microbiology: Recent Results (from the past 720 hour(s))  CULTURE, BLOOD (ROUTINE X 2)     Status: None   Collection Time    05/11/12  4:35 PM      Result Value Range Status   Specimen Description BLOOD LEFT HAND   Final   Special Requests BOTTLES DRAWN AEROBIC AND ANAEROBIC 6CC EACH   Final   Culture NO GROWTH 3 DAYS   Final   Report Status PENDING   Incomplete  CULTURE, BLOOD (ROUTINE X 2)     Status: None   Collection Time    05/11/12  4:35 PM      Result Value Range Status   Specimen Description RIGHT ANTECUBITAL   Final   Special Requests BOTTLES DRAWN AEROBIC AND ANAEROBIC 8CC EACH   Final   Culture NO GROWTH 3 DAYS   Final   Report Status PENDING   Incomplete    Medical History: Past Medical History  Diagnosis Date  . Hyperlipidemia   . Hypertension   . Myocardial infarction 2008  . DVT (deep venous thrombosis)   . Peripheral vascular disease   . Heart  murmur   . Hiatal hernia   . Arthritis   . Depression with anxiety   . Cancer     lung cancer  . CAD (coronary artery disease)     Medications:  Scheduled:  . ceFEPime (MAXIPIME) IV  2 g Intravenous Q12H  . enoxaparin (LOVENOX) injection  40 mg Subcutaneous Q24H  . [COMPLETED] levofloxacin (LEVAQUIN) IV  750 mg Intravenous Q24H  . nicotine  14 mg Transdermal Daily  . sodium chloride  3 mL Intravenous Q12H  . vancomycin  1,000 mg Intravenous Q12H    Assessment: 79 yo M with hx lung CA from nursing facility with shortness of breath & new opacities on CXR.  Currently on day#4 empiric, broad-spectrum antibiotics for HCAP.   Awaiting results from thoracentesis.    His renal function is borderline for renal dose adjustments but has been relatively stable throughout admission.   Goal of Therapy:  Vancomycin trough level 15-20 mcg/ml  Plan:  1) Cefepime 2gm IV Q12h 2) Vancomycin 1gm IV Q12h 3) Check Vancomycin trough tomorrow 5) Monitor renal function and cx data  6) Duration of therapy per MD  [name] [name] 05/14/2012,2:18 PM

## 2012-05-14 NOTE — Progress Notes (Signed)
*  PRELIMINARY RESULTS* Echocardiogram 2D Echocardiogram has been performed.  Conrad Uhland 05/14/2012, 11:31 AM

## 2012-05-14 NOTE — Clinical Social Work Note (Signed)
Darel Hong from [facility] states that they received patient approx one month ago at discharge from [place].  Patient is being seen by oncologist Dr Paulo Fruit at [facility] for lung cancer treatments. Next appointment is 07-10-12.  Per sister, no chemo or radiation treatments are planned. Has PT at facility from [facility] has been given and patient is current w [facility] as PCP.  PT has told [facility] that they want to recommend hospice be involved w patient, hospice will be contacted by ALF when patient is discharged.  [facility] is agreeable to taking patient back at discharge.  Santa Genera, LCSW Clinical Social Worker [phone])

## 2012-05-14 NOTE — Progress Notes (Signed)
     Subjective: This man was admitted  with dyspnea. He is found to have a moderate-sized left pleural effusion as well as a mass in the right lung, consistent with a previous diagnosis of lung cancer. There is also associated atelectasis in this area. The patient himself cannot give me a very clear history, he apparently has a history of dementia. Today, the patient underwent thoracentesis of the left pleural effusion. His breathing appears to be better according to him.           Physical Exam: Blood pressure 130/75, pulse 100, temperature 98 F (36.7 C), temperature source Oral, resp. rate 19, height 6' (1.829 m), weight 77.111 kg (170 lb), SpO2 93.00%. Surprisingly, he looks systemically well, slightly frail. There is no supraclavicular lymphadenopathy. Lung fields improved air entry on the left side. Heart sounds are present and normal without murmurs or gallop rhythm, but appeared to be irregular. He also does have peripheral pitting edema in his legs. He does appear to be alert and orientated.   Investigations:     Basic Metabolic Panel:  Recent Labs  16/10/96 1618 05/12/12 0539 05/13/12 0528 05/14/12 0515  NA 141 141 137 141  K 4.0 3.8 3.9 4.2  CL 104 106 104 110  CO2 27 24 22 22   GLUCOSE 114* 90 110* 83  BUN 16 15 19 18   CREATININE 1.41* 1.28 1.41* 1.35  CALCIUM 9.1 8.5 8.6 8.6  MG  --  1.8  --   --    Liver Function Tests:  Recent Labs  05/11/12 1618 05/13/12 0528  AST 16 16  ALT 12 12  ALKPHOS 125* 109  BILITOT 0.7 0.5  PROT 6.4 5.9*  ALBUMIN 3.4* 2.9*     CBC:  Recent Labs  05/11/12 1618 05/12/12 0539 05/13/12 0528  WBC 9.6 7.0 9.0  NEUTROABS 5.8  --   --   HGB 14.6 13.8 14.2  HCT 43.9 41.8 43.6  MCV 96.3 97.0 96.9  PLT 123* 112* 112*    Dg Chest 1 View  05/14/2012  *RADIOLOGY REPORT*  Clinical Data: Post thoracentesis.  CHEST - 1 VIEW  Comparison: 05/11/2012  Findings: Decreasing left pleural effusion following left  thoracentesis.  No pneumothorax.  Continued airspace opacity in the left lung and mass-like opacity in the right lung base, unchanged.  IMPRESSION: Decreasing left effusion following thoracentesis.  No pneumothorax. Otherwise no change.   Original Report Authenticated By: Charlett Nose, M.D.       Medications: I have reviewed the patient's current medications.  Impression: 1. Left moderate sized pleural effusion, etiology unclear. There is a likelihood that this is malignant, however this could be a manifestation of congestive heart failure also. 2. COPD. 3. Lung cancer right side, details are not clear. 4. Apparent dementia. 5. Irregular heart rate.     Plan: 1. 12-lead ECG. 2. Await cytology of the pleural fluid.     LOS: 3 days   Wilson Singer Pager [phone]  05/14/2012, 1:45 PM

## 2012-05-14 NOTE — Progress Notes (Signed)
Dr. Karilyn Cota notified via text page that pt's 2D echo results were back.  Dr. Karilyn Cota gave orders for patient to have a cardiology consult.  Orders followed.

## 2012-05-15 DIAGNOSIS — B371 Pulmonary candidiasis: Secondary | ICD-10-CM

## 2012-05-15 DIAGNOSIS — I739 Peripheral vascular disease, unspecified: Secondary | ICD-10-CM

## 2012-05-15 DIAGNOSIS — I25810 Atherosclerosis of coronary artery bypass graft(s) without angina pectoris: Secondary | ICD-10-CM

## 2012-05-15 DIAGNOSIS — C3490 Malignant neoplasm of unspecified part of unspecified bronchus or lung: Secondary | ICD-10-CM

## 2012-05-15 DIAGNOSIS — I429 Cardiomyopathy, unspecified: Secondary | ICD-10-CM

## 2012-05-15 DIAGNOSIS — Z85118 Personal history of other malignant neoplasm of bronchus and lung: Secondary | ICD-10-CM

## 2012-05-15 DIAGNOSIS — I5189 Other ill-defined heart diseases: Secondary | ICD-10-CM

## 2012-05-15 DIAGNOSIS — I10 Essential (primary) hypertension: Secondary | ICD-10-CM

## 2012-05-15 DIAGNOSIS — I519 Heart disease, unspecified: Secondary | ICD-10-CM

## 2012-05-15 LAB — COMPREHENSIVE METABOLIC PANEL
AST: 14 U/L (ref 0–37)
Albumin: 2.6 g/dL — ABNORMAL LOW (ref 3.5–5.2)
Chloride: 109 mEq/L (ref 96–112)
Creatinine, Ser: 1.37 mg/dL — ABNORMAL HIGH (ref 0.50–1.35)
Total Bilirubin: 0.6 mg/dL (ref 0.3–1.2)
Total Protein: 5.3 g/dL — ABNORMAL LOW (ref 6.0–8.3)

## 2012-05-15 LAB — CBC
MCHC: 32.1 g/dL (ref 30.0–36.0)
MCV: 98 fL (ref 78.0–100.0)
Platelets: 89 10*3/uL — ABNORMAL LOW (ref 150–400)
RDW: 15.8 % — ABNORMAL HIGH (ref 11.5–15.5)
WBC: 8.3 10*3/uL (ref 4.0–10.5)

## 2012-05-15 MED ORDER — FUROSEMIDE 20 MG PO TABS: 20.0000 mg | ORAL_TABLET | Freq: Two times a day (BID) | ORAL | Status: DC

## 2012-05-15 MED ORDER — CARVEDILOL 3.125 MG PO TABS: 3.1250 mg | ORAL_TABLET | Freq: Two times a day (BID) | ORAL | Status: AC

## 2012-05-15 MED ORDER — LEVOFLOXACIN 750 MG PO TABS: 750.0000 mg | ORAL_TABLET | Freq: Every day | ORAL | Status: DC

## 2012-05-15 MED ORDER — LISINOPRIL 5 MG PO TABS: 5.0000 mg | ORAL_TABLET | Freq: Every day | ORAL | Status: AC

## 2012-05-15 MED ORDER — FUROSEMIDE 20 MG PO TABS
20.0000 mg | ORAL_TABLET | Freq: Every day | ORAL | Status: DC
  Administered 2012-05-15: 20 mg via ORAL
  Filled 2012-05-15: qty 1

## 2012-05-15 MED ORDER — CARVEDILOL 3.125 MG PO TABS
3.1250 mg | ORAL_TABLET | Freq: Two times a day (BID) | ORAL | Status: DC
  Administered 2012-05-15: 3.125 mg via ORAL
  Filled 2012-05-15: qty 1

## 2012-05-15 MED ORDER — FUROSEMIDE 20 MG PO TABS: 20.0000 mg | ORAL_TABLET | Freq: Every day | ORAL | Status: DC

## 2012-05-15 NOTE — Discharge Summary (Addendum)
Physician Discharge Summary  [name] [name] ZOX:[id number] DOB: 21-Jun-1933 DOA: 05/11/2012  PCP: Josue Hector, MD  Admit date: 05/11/2012 Discharge date: 05/15/2012  Time spent: Greater than 30 minutes  Recommendations for Outpatient Follow-up:  1. Followup with radiation oncology at [facility].   Discharge Diagnoses:  1. Acute systolic congestive heart failure, ejection fraction of only 15%. 2. Left moderate pleural effusion, status post thoracentesis, likely secondary to #1. 3. Right lung mass, likely lung cancer with associated healthcare associated pneumonia.Marland Kitchen 4. Probable dementia. 5. Peripheral vascular disease. 6. History of coronary artery disease with CABG. 7. Hypertension.   Discharge Condition: Stable.  Diet recommendation: Regular.  Filed Weights   05/13/12 0445 05/13/12 0456 05/14/12 0300  Weight: 78.835 kg (173 lb 12.8 oz) 76.658 kg (169 lb) 77.111 kg (170 lb)    History of present illness:  This 79 year old man, who has really no old records to look at, presented with symptoms of dyspnea. Please see initial history as outlined below: [name] [name] is an 79 y.o. male. Elderly Caucasian gentleman from [facility] who is brought to the emergency room with a history of shortness of breath. The patient is too demented to give a personal history to me; although calm, and not ill-looking, he is oriented currently only to himself, he gives the year as 1914, and is sometimes unclear as to whether he lives at a nursing home or at home. He states that she has cancer, but is unable to give details.  Several phone numbers listed in the chart, for his daughter and home, are out of order when called.  On the records he was brought to the emergency room because of shortness of breath and some coughing and a history of lung cancer. The records show that he had lung cancer surgery sometime before 1999.  Records indicate history of extensive vascular bypass surgery,  left axillary to femoral  fem-fem bypass and right fem-pop bypas, for peripheral vascular disease, and heavy alcohol and tobacco use history.  There is no history of fever chills or hemoptysis. We are unable to get a history of weight loss.  A chest x-ray was done which was abnormal. He was noted to be saturating at 84% on room air; it is not clear if he uses home oxygen at home. He was felt to have healthcare associated pneumonia and the hospitalist service was called for admission.  Hospital Course:  The patient was admitted and it was felt that he may have pneumonia healthcare associated. He was started on antibiotics for this. He also was found to have a moderately sized left-sided pleural effusion. This was tapped with thoracentesis, 1 L was obtained. We are awaiting cytology but the protein level of 1.2 indicates that it probably is a transudate. This would be in keeping with congestive heart failure. He was found to have an ejection fraction of only 15% by echocardiogram. Cardiology was consulted and they did not feel that he is a candidate for any further therapeutic/investigative treatment such as cardiac catheterization. He should be managed only medically. I've spoken with his radiation oncologist at [facility] who was surprised to learn that he appears to have a large mass in the right side of his lung, he will arrange a sooner appointment for him. In view of his overall medical problems, I discussed with them CODE STATUS and he is a DO NOT RESUSCITATE. Also I think he would probably benefit from hospice services as his prognosis is likely less  than 6 months. Since he is stable now, I think he is safe to discharge back to [facility]. However I would not be surprised if he would were to be admitted to hospital again. Procedures:  Thoracentesis by radiology.    Consultations:  Cardiology, Dr. Diona Browner.  Discharge Exam: Filed Vitals:   05/14/12 1618 05/14/12 2146 05/14/12 2342 05/15/12  0655  BP: 109/74 104/68  111/76  Pulse: 84 93  90  Temp:  97.9 F (36.6 C)  98.1 F (36.7 C)  TempSrc:  Oral  Oral  Resp:  20  20  Height:      Weight:      SpO2:  98% 98% 98%    General: He looks chronically sick. He is slightly cachectic. Cardiovascular: Heart sounds are present with a soft systolic murmur. He is in sinus rhythm. Jugular venous pressure is not elevated. Respiratory: Lung fields show reduced air entry in both lower lobes. Otherwise it is clear. He is alert but appears to be somewhat confused, and this is probably his baseline dementia.  Discharge Instructions  Discharge Orders   Future Orders Complete By Expires     Diet - low sodium heart healthy  As directed     Increase activity slowly  As directed         Medication List    TAKE these medications       carvedilol 3.125 MG tablet  Commonly known as:  COREG  Take 1 tablet (3.125 mg total) by mouth 2 (two) times daily with a meal.     furosemide 20 MG tablet  Commonly known as:  LASIX  Take 1 tablet (20 mg total) by mouth daily.     levofloxacin 750 MG tablet  Commonly known as:  LEVAQUIN  Take 1 tablet (750 mg total) by mouth daily.     lisinopril 5 MG tablet  Commonly known as:  PRINIVIL,ZESTRIL  Take 1 tablet (5 mg total) by mouth daily.     mirtazapine 15 MG tablet  Commonly known as:  REMERON  Take 15 mg by mouth at bedtime as needed (for sleep).     PROAIR HFA 108 (90 BASE) MCG/ACT inhaler  Generic drug:  albuterol  Inhale 1-2 puffs into the lungs every 4 (four) hours as needed for wheezing.          The results of significant diagnostics from this hospitalization (including imaging, microbiology, ancillary and laboratory) are listed below for reference.    Significant Diagnostic Studies: Dg Chest 1 View  05/14/2012  *RADIOLOGY REPORT*  Clinical Data: Post thoracentesis.  CHEST - 1 VIEW  Comparison: 05/11/2012  Findings: Decreasing left pleural effusion following left  thoracentesis.  No pneumothorax.  Continued airspace opacity in the left lung and mass-like opacity in the right lung base, unchanged.  IMPRESSION: Decreasing left effusion following thoracentesis.  No pneumothorax. Otherwise no change.   Original Report Authenticated By: Charlett Nose, M.D.    Ct Angio Chest Pe W/cm &/or Wo Cm  05/12/2012  *RADIOLOGY REPORT*  Clinical Data: Shortness of breath and wheezing; known lung cancer.  CT ANGIOGRAPHY CHEST  Technique:  Multidetector CT imaging of the chest using the standard protocol during bolus administration of intravenous contrast. Multiplanar reconstructed images including MIPs were obtained and reviewed to evaluate the vascular anatomy.  Contrast: OMNIPAQUE IOHEXOL 350 MG/ML SOLN  Comparison: Chest radiograph performed 05/11/2012  Findings: There is no evidence of pulmonary embolus.  A 5.7 x 3.0 cm mass is noted extending  between the right middle and lower lung lobes, compatible with malignancy.  Surrounding atelectasis is seen.  Small right and moderate left pleural effusions are noted; the left- sided pleural effusion is somewhat loculated in appearance.  There is irregular thickening of the left major fissure, with some associated nodularity; metastatic disease cannot be excluded.  No definite additional focal pulmonary nodules are identified.  No pneumothorax is seen.  Evaluation is mildly suboptimal due to motion artifact.  Borderline prominent right hilar nodes are seen, measuring up to 1.0 cm in short axis.  No definite mediastinal lymphadenopathy is appreciated.  Diffuse coronary artery calcifications are seen.  No significant pericardial effusion is identified.  Scattered calcific atherosclerotic disease is noted along the aortic arch and descending thoracic aorta.  Calcific atherosclerotic disease is noted along the great vessels; the great vessels are otherwise grossly unremarkable in appearance.  No axillary lymphadenopathy is seen.  The thyroid  gland is unremarkable in appearance.  A vascular stent graft is noted along the left chest wall.  The visualized portions of the liver and spleen are unremarkable. Mildly increased attenuation within the contracted gallbladder could reflect stones; the gallbladder is otherwise grossly unremarkable.  Significant mural thrombus is seen along the abdominal aorta at and distal to the renal arteries, resulting in severe aortic stenosis. Diffuse calcification is noted along the visualized renal arteries bilaterally.  Nonspecific perinephric stranding is seen bilaterally.  No acute osseous abnormalities are seen.  There is chronic deformity of the left 5th posterolateral rib, reflecting remote injury.  IMPRESSION:  1.  No evidence of pulmonary embolus. 2.  5.7 x 3.0 cm mass extending between the right middle and lower lung lobes, compatible with malignancy.  Surrounding atelectasis noted. 3.  Small right and moderate left pleural effusion; the left-sided pleural effusion is somewhat loculated.  Irregular thickening along the left major fissure, with associated nodularity; underlying metastatic disease cannot be excluded. 4.  Borderline prominent right hilar nodes, measuring up to 1.0 cm in short axis; no definite mediastinal lymphadenopathy seen. 5.  Diffuse coronary artery calcifications seen. 6.  Question of cholelithiasis; gallbladder contracted and otherwise grossly unremarkable. 7.  Large amount of mural thrombus along the abdominal aorta, at and distal to the renal arteries, resulting in severe aortic stenosis.  Would correlate clinically for associated symptoms, and consider further evaluation as deemed clinically appropriate. Diffuse calcification along the renal arteries bilaterally.   Original Report Authenticated By: Tonia Ghent, M.D.    Dg Chest Portable 1 View  05/11/2012  *RADIOLOGY REPORT*  Clinical Data: Shortness of breath  PORTABLE CHEST - 1 VIEW  Comparison: 04/13/2012, 04/12/2012  Findings: Postop  changes in the left hilum.  Patchy right hilar focal airspace opacity and worsening dense consolidation in the left lower lobe obscuring the left cardiac border.  Chronic pleural thickening versus effusion on the left.  No pneumothorax or edema pattern.  Degenerative changes of the spine.  IMPRESSION: Worsening patchy bibasilar airspace disease/consolidation. Pneumonia not excluded.  Chronic left pleural thickening versus pleural effusion  Postop changes left hilum  Recommend radiographic follow-up to document resolution   Original Report Authenticated By: Judie Petit. Shick, M.D.    US Thoracentesis Asp Pleural Space W/img Guide  05/15/2012  *RADIOLOGY REPORT*  Clinical Data:  Pleural effusion, question malignant.  ULTRASOUND GUIDED LEFT THORACENTESIS  Comparison:  Chest CT 05/12/2012.  An ultrasound guided thoracentesis was thoroughly discussed with the patient and questions answered.  The benefits, risks, alternatives and complications were also discussed.  The  patient understands and wishes to proceed with the procedure.  Written consent was obtained.  Ultrasound was performed to localize and mark an adequate pocket of fluid in the left chest.  The area was then prepped and draped in the normal sterile fashion.  1% Lidocaine was used for local anesthesia.  Under ultrasound guidance a 19 gauge Yueh catheter was introduced.  Thoracentesis was performed.  The catheter was removed and a dressing applied.  Complications:  None  Findings: A total of approximately 1 liter of clear yellow fluid was removed. A fluid sample was sent for laboratory analysis.  IMPRESSION: Successful ultrasound guided left thoracentesis yielding 1 liter of pleural fluid.   Original Report Authenticated By: Charlett Nose, M.D.     Microbiology: Recent Results (from the past 240 hour(s))  CULTURE, BLOOD (ROUTINE X 2)     Status: None   Collection Time    05/11/12  4:35 PM      Result Value Range Status   Specimen Description BLOOD LEFT HAND    Final   Special Requests BOTTLES DRAWN AEROBIC AND ANAEROBIC 6CC EACH   Final   Culture NO GROWTH 3 DAYS   Final   Report Status PENDING   Incomplete  CULTURE, BLOOD (ROUTINE X 2)     Status: None   Collection Time    05/11/12  4:35 PM      Result Value Range Status   Specimen Description RIGHT ANTECUBITAL   Final   Special Requests BOTTLES DRAWN AEROBIC AND ANAEROBIC 8CC EACH   Final   Culture NO GROWTH 3 DAYS   Final   Report Status PENDING   Incomplete  BODY FLUID CULTURE     Status: None   Collection Time    05/14/12  1:24 PM      Result Value Range Status   Specimen Description FLUID LEFT PLEURAL   Final   Special Requests NONE   Final   Gram Stain     Final   Value: NO WBC SEEN     NO ORGANISMS SEEN   Culture NO GROWTH 1 DAY   Final   Report Status PENDING   Incomplete     Labs: Basic Metabolic Panel:  Recent Labs Lab 05/11/12 1618 05/12/12 0539 05/13/12 0528 05/14/12 0515 05/15/12 0600  NA 141 141 137 141 140  K 4.0 3.8 3.9 4.2 4.3  CL 104 106 104 110 109  CO2 27 24 22 22 23   GLUCOSE 114* 90 110* 83 81  BUN 16 15 19 18 17   CREATININE 1.41* 1.28 1.41* 1.35 1.37*  CALCIUM 9.1 8.5 8.6 8.6 8.5  MG  --  1.8  --   --   --    Liver Function Tests:  Recent Labs Lab 05/11/12 1618 05/13/12 0528 05/15/12 0600  AST 16 16 14   ALT 12 12 10   ALKPHOS 125* 109 98  BILITOT 0.7 0.5 0.6  PROT 6.4 5.9* 5.3*  ALBUMIN 3.4* 2.9* 2.6*    Recent Labs Lab 05/11/12 1618  LIPASE 25    CBC:  Recent Labs Lab 05/11/12 1618 05/12/12 0539 05/13/12 0528 05/15/12 0600  WBC 9.6 7.0 9.0 8.3  NEUTROABS 5.8  --   --   --   HGB 14.6 13.8 14.2 13.9  HCT 43.9 41.8 43.6 43.3  MCV 96.3 97.0 96.9 98.0  PLT 123* 112* 112* 89*        Signed:  Keiran Sias C  [facility] 05/15/2012, 10:10 AM

## 2012-05-15 NOTE — Progress Notes (Signed)
AVS reviewed with patient's caregiver from [facility].  Verbalized understanding of d/c instructions.  Prescriptions and packet provided to caregiver.  Pt's IV removed.  Site WNL.  Pt assisted into wheelchair for discharge.  Nurse got pt's FL2 out of chart and gave to caregiver.  Caregiver informed to wait for nurse to escort them out.  Caregiver left floor with patient before they could be escorted out.  Secretary went to find them but they had already left.

## 2012-05-15 NOTE — Consult Note (Signed)
CARDIOLOGY CONSULT NOTE     Patient ID: [name] [name] MRN: [id number] DOB/AGE: 1933-09-29 79 y.o.  Admit date: 05/11/2012 Referring Physician: PTH-Gosrani Primary Physician Josue Hector, MD Consulting Cardiologist: Diona Browner  Reason for Consultation: Severe systolic dysfunction EF of 15%  HPI: [name] [name] is a 79 year old patient with multiple medical problems admitted on 05/11/2012 with increasing shortness of breath with probable pneumonia. The patient has dementia and is unable to answer questions concerning his history or events leading to hospitalization. History is obtained from current medical records. He is a resident of [facility] rest home, no family members are available to obtain history. He was found to have a pleural effusion which required thoracentesis, also progressing size of a lung mass involving the right middle and lower lobes consistent with progressive malignancy. Echocardiogram was completed on admission revealing severe systolic dysfunction with an ejection fraction of 15%. We are asked for cardiology recommendations in this setting.   He has a significant past medical history for coronary artery disease (details unclear), peripheral vascular disease with bifemoral bypass followed by Dr. Cornelius Moras and Dr. Madilyn Fireman. Indeed recent CT imaging showed a large amount of mural thrombus involving the abdominal aorta and resulting in apparent stenosis of the distal vasculature. Diffuse coronary artery calcifications were described as well.  At this point patient felt to have pneumonia, also progressive malignancy in light of right lung mass changes. He is followed by radiation oncology at [facility]. He presently has a DO NOT RESUSCITATE status, being considered possibly for hospice care.  Review of systems complete and found to be negative unless listed above   Past Medical History  Diagnosis Date  . Hyperlipidemia   . Hypertension   . Myocardial infarction 2008  . DVT (deep venous  thrombosis)   . Peripheral vascular disease   . Heart murmur   . Hiatal hernia   . Arthritis   . Depression with anxiety   . Cancer     lung cancer  . CAD (coronary artery disease)     Family History:Patient has dementia and is unable to provide information on family history,  History   Social History  . Marital Status: Divorced    Spouse Name: N/A    Number of Children: N/A  . Years of Education: N/A   Occupational History  . Not on file.   Social History Main Topics  . Smoking status: Current Every Day Smoker    Types: Cigarettes  . Smokeless tobacco: Not on file  . Alcohol Use: Yes     Comment: 1 pint per day  . Drug Use: Not on file  . Sexually Active: Not on file   Other Topics Concern  . Not on file   Social History Narrative  . No narrative on file    Past Surgical History  Procedure Laterality Date  . Pr vein bypass graft,aorto-fem-pop  09-15-2008    Left axillo bifemoral BPG by Dr. Liliane Bade  . Pr vein bypass graft,aorto-fem-pop  1999    Right Femoral- Popliteal BPG by Dr. Madilyn Fireman  . Lung lobectomy  1999    Left upper Lobectomy and mediastinal lymph node dissection  ( Dr. Horald Chestnut)     Prescriptions prior to admission  Medication Sig Dispense Refill  . albuterol (PROAIR HFA) 108 (90 BASE) MCG/ACT inhaler Inhale 1-2 puffs into the lungs every 4 (four) hours as needed for wheezing.      . mirtazapine (REMERON) 15 MG tablet Take 15 mg by mouth at bedtime  as needed (for sleep).        Physical Exam: Blood pressure 111/76, pulse 90, temperature 98.1 F (36.7 C), temperature source Oral, resp. rate 20, height 6' (1.829 m), weight 170 lb (77.111 kg), SpO2 98.00%.   General: Chronically ill-appearing in no acute distress Head: Eyes PERRLA, No xanthomas.   Normal cephalic and atramatic  Lungs: Some middle lobe crackles, poor inspiratory effort. Heart: HRRR S1 S2, with 1/6 systolic murmur.  Pulses are 2+ & equal.            No carotid bruit. No JVD.   No abdominal bruits. No femoral bruits. Abdomen: Bowel sounds are positive, abdomen soft and non-tender without masses or                  Hernia's noted. Msk:  No kyphosis. Extremities: No clubbing, cyanosis 2+ pretibial edema.  DP +1 Neuro: Alert and oriented X 1 to person, unclear of place or time.Marland Kitchen Psych:  Awake, oriented x 1, poor historian due to dementia.  Labs:   Lab Results  Component Value Date   WBC 8.3 05/15/2012   HGB 13.9 05/15/2012   HCT 43.3 05/15/2012   MCV 98.0 05/15/2012   PLT 89* 05/15/2012     Recent Labs Lab 05/15/12 0600  NA 140  K 4.3  CL 109  CO2 23  BUN 17  CREATININE 1.37*  CALCIUM 8.5  PROT 5.3*  BILITOT 0.6  ALKPHOS 98  ALT 10  AST 14  GLUCOSE 81   Lab Results  Component Value Date   CKTOTAL 116 09/16/2008   CKMB 3.0 09/16/2008   TROPONINI  Value: 0.05        NO INDICATION OF MYOCARDIAL INJURY. 09/16/2008    Echocardiogram: Left ventricle: The cavity size was normal. Wall thickness was increased in a pattern of mild LVH. The estimated ejection fraction was approximately 15%. Diffuse hypokinesis. There is akinesis of the mid-distalanterolateral myocardium. The study is not technically sufficient to allow evaluation of LV diastolic function. No previous study for comparison. - Aortic valve: Mildly calcified annulus. Trileaflet; mildly calcified leaflets. Trivial regurgitation. - Mitral valve: Mildly thickened leaflets . Mild regurgitation. - Left atrium: The atrium was moderately dilated. - Right ventricle: The cavity size was moderately dilated. Systolic function was moderately to severely reduced. - Right atrium: The atrium was mildly dilated. - Tricuspid valve: Mild regurgitation. - Pulmonary arteries: PA peak pressure: 37mm Hg (S). - Inferior vena cava: Not well visualized. The vessel was dilated; the respirophasic diameter changes were blunted (< 50%). Estimated CVP 15 mmHg. - Pericardium, extracardiac: There was no  pericardial effusion. There was a left pleural effusion.    Radiology: Dg Chest 1 View  05/14/2012  *RADIOLOGY REPORT*  Clinical Data: Post thoracentesis.  CHEST - 1 VIEW  Comparison: 05/11/2012  Findings: Decreasing left pleural effusion following left thoracentesis.  No pneumothorax.  Continued airspace opacity in the left lung and mass-like opacity in the right lung base, unchanged.  IMPRESSION: Decreasing left effusion following thoracentesis.  No pneumothorax. Otherwise no change.   Original Report Authenticated By: Charlett Nose, M.D.    CT Scan of the CHEST 05/12/12 IMPRESSION:  1. No evidence of pulmonary embolus. 2. 5.7 x 3.0 cm mass extending between the right middle and lower lung lobes, compatible with malignancy. Surrounding atelectasis noted. 3. Small right and moderate left pleural effusion; the left-sided pleural effusion is somewhat loculated. Irregular thickening along the left major fissure, with associated nodularity; underlying metastatic disease cannot  be excluded. 4. Borderline prominent right hilar nodes, measuring up to 1.0 cm in short axis; no definite mediastinal lymphadenopathy seen. 5. Diffuse coronary artery calcifications seen. 6. Question of cholelithiasis; gallbladder contracted and otherwise grossly unremarkable. 7. Large amount of mural thrombus along the abdominal aorta, at and distal to the renal arteries, resulting in severe aortic stenosis. Would correlate clinically for associated symptoms, and consider further evaluation as deemed clinically appropriate. Diffuse calcification along the renal arteries bilaterally.   EKG: NSR with frequent PACs, poor R wave progression questiona old anterior infarct pattern, diffuse nonspecific ST-T changes.  ASSESSMENT AND PLAN:   1. Severe biventricular systolic dysfunction: LVEF approximately 15% with diffuse hypokinesis and akinesis of the mid-distalanterolateral myocardium. RV systolic function was moderately  to severely reduced. In setting of reported history of CAD and certainly evidence of diffuse atherosclerotic disease, progressive ischemic heart disease is certainly likely, although at this point it would seem that conservative medical therapy would be most appropriate in light of his progressive comorbidities and dementia. Will place on Coreg 3.125 mg twice a day. Review of telemetry does not reveal any ventricular arrhythmias at this time. Otherwise on ACE inhibitor, agree with low-dose diuretic. No further ischemic testing planned.  2. CAD: Details are not clear, presumably status post prior anterior infarct.  3. Abnormal CT of chest: Found to have a 5.7 by 3.0 mass or in the right middle and lower lung lobes compatible with progressive malignancy. He has a history of lung cancer. He now has a DO NOT RESUSCITATE status with consideration for hospice care per discussion with primary team.  4. PAD: Followed by Dr. Madilyn Fireman and Dr. Barry Dienes in the past.   Signed: Bettey Mare. Lyman Bishop NP [facility] 05/15/2012, 9:20 AM Co-Sign MD   Attending note:  Patient seen and examined. Reviewed available records and modified above note by Ms. Lawrence NP. Patient with history of lung cancer, treated by radiation oncology at [facility], now with evidence of progressive disease, associated pneumonia and left pleural effusion. Incidentally noted finding of severe LV dysfunction, biventricular failure with LVEF 15% and akinesis of the mid to distal anterolateral myocardium. He has an ill-defined history of CAD and certainly evidence of diffuse atherosclerotic disease. Now with significant functional limitations, dementia, and plan for DO NOT RESUSCITATE status with potential hospice care. In light of this, recommendation would be for conservative medical therapy as outlined above. Initiating low-dose Coreg, agree with ACE inhibitor and low-dose diuretic. Also aspirin daily if able. No further ischemic workup is planned  at this time.  Jonelle Sidle, M.D., F.A.C.C.

## 2012-05-15 NOTE — Care Management Note (Signed)
    Page 1 of 1   05/15/2012     4:02:22 PM   CARE MANAGEMENT NOTE 05/15/2012  Patient:  [name] [name], [name] [name]   Account Number:  [id number]  Date Initiated:  05/15/2012  Documentation initiated by:  Rosemary Holms  Subjective/Objective Assessment:   Pt admitted from [facility] and will return today.     Action/Plan:   Anticipated DC Date:  05/15/2012   Anticipated DC Plan:  ASSISTED LIVING / REST HOME  In-house referral  Clinical Social Worker      DC Planning Services  CM consult      Choice offered to / List presented to:             Status of service:  Completed, signed off Medicare Important Message given?   (If response is "NO", the following Medicare IM given date fields will be blank) Date Medicare IM given:   Date Additional Medicare IM given:    Discharge Disposition:  ASSISTED LIVING  Per UR Regulation:    If discussed at Long Length of Stay Meetings, dates discussed:    Comments:  05/15/12 AmyRobson RN BSN CM

## 2012-05-15 NOTE — Clinical Social Work Note (Signed)
Patient ready for discharge today and return to [facility].  Dominic Pea from facility notified and agreeable, facility will pick up patient this afternoon in [facility] [place].  FL2 reviewed w RN and updated.  Discharge summary faxed to facility via [facility].  Family notified, patient agreeable to transfer and return to [facility].  Discharge packet prepared and placed w shadow chart for transport.  CSW signing off as no further SW needs identified.  Santa Genera, LCSW Clinical Social Worker [phone])

## 2012-05-16 LAB — CULTURE, BLOOD (ROUTINE X 2): Culture: NO GROWTH

## 2012-05-18 LAB — BODY FLUID CULTURE
Culture: NO GROWTH
Gram Stain: NONE SEEN

## 2012-05-25 NOTE — Progress Notes (Signed)
UR Chart Review Completed  

## 2012-06-24 ENCOUNTER — Encounter: Payer: Self-pay | Admitting: Cardiology

## 2012-06-27 ENCOUNTER — Encounter: Payer: Self-pay | Admitting: Cardiology

## 2012-06-27 ENCOUNTER — Ambulatory Visit (INDEPENDENT_AMBULATORY_CARE_PROVIDER_SITE_OTHER): Payer: Medicare Other | Admitting: Cardiology

## 2012-06-27 ENCOUNTER — Ambulatory Visit: Payer: Medicare Other | Admitting: Cardiology

## 2012-06-27 VITALS — BP 92/50 | HR 74 | Wt 137.4 lb

## 2012-06-27 DIAGNOSIS — I2510 Atherosclerotic heart disease of native coronary artery without angina pectoris: Secondary | ICD-10-CM

## 2012-06-27 DIAGNOSIS — I429 Cardiomyopathy, unspecified: Secondary | ICD-10-CM

## 2012-06-27 DIAGNOSIS — I10 Essential (primary) hypertension: Secondary | ICD-10-CM

## 2012-06-27 NOTE — Patient Instructions (Signed)
Continue all current medications. Follow up in  3 months 

## 2012-06-27 NOTE — Assessment & Plan Note (Signed)
Blood pressure low-normal at this time, asymptomatic at present.

## 2012-06-27 NOTE — Assessment & Plan Note (Signed)
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 Plan is conservative management, continue medical therapy. No specific changes are made today. He has significant comorbidities that seem to be near end stage based on available information and recent workup, now has Hospice in place.

## 2012-06-27 NOTE — Assessment & Plan Note (Signed)
Details are not clear. Plan is to continue medical therapy. No active angina symptoms at this time.

## 2012-06-27 NOTE — Progress Notes (Signed)
Clinical Summary [name] [name] is a medically complex 79 y.o.male seen recently in consultation in March of this year with pneumonia, also findings of progressive malignancy with right lung mass, was undergoing radiation treatments and [facility] at that time. We were asked to see him related to documentation of cardiomyopathy, LVEF 15% in the setting of reported history of CAD. Conservative management was recommended, medical therapy initiated. He had a DNR status at that time with hospice referral being considered.  Echocardiogram at that time demonstrated mild LVH with LVEF 15%, diffuse hypokinesis, akinesis of the mid distal anterolateral wall, mildly calcified AV, mild MR, moderate left atrial enlargement, moderate RV dilatation with severely reduced function, mild TR, RVSP 37 mm mercury.  Lab work in March revealed potassium 4.3, BUN 17, creatinine 1.3.  He resides at [facility]. He is here with an aide, using a wheelchair. Status of his lung cancer and COPD is not entirely certain. My understanding is that he has active Hospice nursing in place.  He states that his breathing status is unchanged and his leg edema has resolved.  No Known Allergies  Current Outpatient Prescriptions  Medication Sig Dispense Refill  . albuterol (PROAIR HFA) 108 (90 BASE) MCG/ACT inhaler Inhale 1-2 puffs into the lungs every 4 (four) hours as needed for wheezing.      . carvedilol (COREG) 3.125 MG tablet Take 1 tablet (3.125 mg total) by mouth 2 (two) times daily with a meal.  60 tablet  0  . furosemide (LASIX) 40 MG tablet Take 40 mg by mouth daily.      Marland Kitchen lisinopril (PRINIVIL,ZESTRIL) 5 MG tablet Take 1 tablet (5 mg total) by mouth daily.  30 tablet  0  . mirtazapine (REMERON) 15 MG tablet Take 15 mg by mouth at bedtime as needed (for sleep).       No current facility-administered medications for this visit.    Past Medical History  Diagnosis Date  . Mixed hyperlipidemia   . Essential hypertension,  benign   . Myocardial infarction 2008  . DVT (deep venous thrombosis)   . Peripheral vascular disease   . Dementia   . Hiatal hernia   . Arthritis   . Depression with anxiety   . Lung cancer     Progressive - right lung mass with effusion, sees radiation oncology at [facility]  . CAD (coronary artery disease)     Details not clear  . Cardiomyopathy     LVEF 15% with biventricular failure    Past Surgical History  Procedure Laterality Date  . Pr vein bypass graft,aorto-fem-pop  09-15-2008    Left axillo bifemoral BPG by Dr. Liliane Bade  . Pr vein bypass graft,aorto-fem-pop  1999    Right Femoral- Popliteal BPG by Dr. Madilyn Fireman  . Lung lobectomy  1999    Left upper Lobectomy and mediastinal lymph node dissection  ( Dr. Horald Chestnut)    Social History [name] [name] reports that he has been smoking Cigarettes.  He has been smoking about 0.00 packs per day. He does not have any smokeless tobacco history on file. [name] [name] reports that  drinks alcohol.  Review of Systems No palpitations or syncope. No fevers or cough. Appetite fair. No orthopnea. Otherwise negative.  Physical Examination Filed Vitals:   06/27/12 1413  BP: 92/50  Pulse: 74   Filed Weights   06/27/12 1413  Weight: 137 lb 6.4 oz (62.324 kg)   Chronically ill-appearing male no acute distress, smells strongly of cigarettes. HEENT:  Conjunctiva and lids normal, oropharynx clear with poor dentition. Neck: Supple, no carotid bruits, no thyromegaly. Lungs: Significantly diminished breath sounds, nonlabored breathing at rest. No wheezing. Cardiac: Distant heart sounds, indistinct PMI, regular rate and rhythm, no S3, no pericardial rub. Abdomen: Soft, nontender,  bowel sounds present. Extremities: No pitting edema, distal pulses 1+. Skin: Warm and dry. Musculoskeletal: No kyphosis. Neuropsychiatric: Alert and oriented x3, affect grossly appropriate.   Problem List and Plan   Secondary cardiomyopathy, unspecified Probable  mixed cardiomyopathy in the setting of ischemic heart disease. Plan is conservative management, continue medical therapy. No specific changes are made today. He has significant comorbidities that seem to be near end stage based on available information and recent workup, now has Hospice in place.  Coronary atherosclerosis of native coronary artery Details are not clear. Plan is to continue medical therapy. No active angina symptoms at this time.  Essential hypertension, benign Blood pressure low-normal at this time, asymptomatic at present.    Jonelle Sidle, M.D., F.A.C.C.

## 2012-10-01 ENCOUNTER — Ambulatory Visit: Payer: Medicare Other | Admitting: Cardiology

## 2012-10-23 ENCOUNTER — Encounter: Payer: Self-pay | Admitting: Cardiology

## 2012-10-23 ENCOUNTER — Ambulatory Visit (INDEPENDENT_AMBULATORY_CARE_PROVIDER_SITE_OTHER): Payer: Medicare Other | Admitting: Cardiology

## 2012-10-23 VITALS — BP 107/64 | HR 96 | Ht 70.0 in | Wt 154.0 lb

## 2012-10-23 DIAGNOSIS — C3490 Malignant neoplasm of unspecified part of unspecified bronchus or lung: Secondary | ICD-10-CM

## 2012-10-23 DIAGNOSIS — I429 Cardiomyopathy, unspecified: Secondary | ICD-10-CM

## 2012-10-23 DIAGNOSIS — I2510 Atherosclerotic heart disease of native coronary artery without angina pectoris: Secondary | ICD-10-CM

## 2012-10-23 NOTE — Patient Instructions (Addendum)
Your physician recommends that you schedule a follow-up appointment in: 4 MONTHS WITH DR MCDOWELL  Your physician recommends that you continue on your current medications as directed. Please refer to the Current Medication list given to you today.

## 2012-10-23 NOTE — Assessment & Plan Note (Signed)
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 Plan is to conservative management, continue medical therapy. No changes were made to medication doses. Depending on his blood pressure (previously hypotensive) he might be able to tolerate an increase in Coreg eventually to 6.25 mg twice daily. Although weight is up significantly, he has no edema and his lung exam is stable. Lasix could always be advanced if there were concerns about accumulating fluid.

## 2012-10-23 NOTE — Progress Notes (Signed)
Clinical Summary [name] [name] is a medically complex 79 y.o.male last seen in April. He was managed conservatively with probable mixed cardiomyopathy, details in the prior note reviewed. He was having Hospice care at the last assessment. Still resides at [facility]. Sounds like he is getting having a Hospice nurse visit once a week.  Weight is up 17 pounds since April. He attributes this to a very good appetite. He reports no leg edema or orthopnea. Reports compliance with his medications.  He states that he is not undergoing any active treatment for lung cancer. Has had some problems with mild hemoptysis.   No Known Allergies  Current Outpatient Prescriptions  Medication Sig Dispense Refill  . albuterol (PROAIR HFA) 108 (90 BASE) MCG/ACT inhaler Inhale 1-2 puffs into the lungs every 4 (four) hours as needed for wheezing.      . carvedilol (COREG) 3.125 MG tablet Take 1 tablet (3.125 mg total) by mouth 2 (two) times daily with a meal.  60 tablet  0  . furosemide (LASIX) 40 MG tablet Take 20 mg by mouth daily.       Marland Kitchen levofloxacin (LEVAQUIN) 750 MG tablet Take 750 mg by mouth daily.      Marland Kitchen lisinopril (PRINIVIL,ZESTRIL) 5 MG tablet Take 1 tablet (5 mg total) by mouth daily.  30 tablet  0  . mirtazapine (REMERON) 15 MG tablet Take 15 mg by mouth at bedtime as needed (for sleep).       No current facility-administered medications for this visit.    Past Medical History  Diagnosis Date  . Mixed hyperlipidemia   . Essential hypertension, benign   . Myocardial infarction 2008  . DVT (deep venous thrombosis)   . Peripheral vascular disease   . Dementia   . Hiatal hernia   . Arthritis   . Depression with anxiety   . Lung cancer     Progressive - right lung mass with effusion, sees radiation oncology at [facility]  . CAD (coronary artery disease)     Details not clear  . Cardiomyopathy     LVEF 15% with biventricular failure    Social History [name] [name] reports that he has been smoking  Cigarettes.  He has been smoking about 0.00 packs per day. He does not have any smokeless tobacco history on file. [name] [name] reports that  drinks alcohol.  Review of Systems As outlined above.  Physical Examination Filed Vitals:   10/23/12 0954  BP: 107/64  Pulse: 96   Filed Weights   10/23/12 0954  Weight: 154 lb (69.854 kg)    Chronically ill-appearing male no acute distress. HEENT: Conjunctiva and lids normal, oropharynx clear with poor dentition.  Neck: Supple, no carotid bruits, no thyromegaly.  Lungs: Significantly diminished breath sounds with scattered rhonchi, nonlabored breathing at rest. No wheezing.  Cardiac: Distant heart sounds, indistinct PMI, regular rate and rhythm, no S3, no pericardial rub.  Abdomen: Soft, nontender, bowel sounds present.  Extremities: No pitting edema, distal pulses 1+.    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 Plan is to conservative management, continue medical therapy. No changes were made to medication doses. Depending on his blood pressure (previously hypotensive) he might be able to tolerate an increase in Coreg eventually to 6.25 mg twice daily. Although weight is up significantly, he has no edema and his lung exam is stable. Lasix could always be advanced if there were concerns about accumulating  fluid.  Coronary atherosclerosis of native coronary artery Details are not clear. He is not reporting any active angina.  Lung cancer The patient states he is not undergoing any active treatments with Hospice in place.    Jonelle Sidle, M.D., F.A.C.C.

## 2012-10-23 NOTE — Assessment & Plan Note (Signed)
Details are not clear. He is not reporting any active angina.

## 2012-10-23 NOTE — Assessment & Plan Note (Signed)
The patient states he is not undergoing any active treatments with Hospice in place.

## 2013-03-05 ENCOUNTER — Ambulatory Visit (INDEPENDENT_AMBULATORY_CARE_PROVIDER_SITE_OTHER): Payer: Medicare Other | Admitting: Cardiology

## 2013-03-05 ENCOUNTER — Encounter: Payer: Self-pay | Admitting: Cardiology

## 2013-03-05 VITALS — BP 107/58 | HR 79 | Ht 70.0 in | Wt 164.0 lb

## 2013-03-05 DIAGNOSIS — I2510 Atherosclerotic heart disease of native coronary artery without angina pectoris: Secondary | ICD-10-CM

## 2013-03-05 DIAGNOSIS — I429 Cardiomyopathy, unspecified: Secondary | ICD-10-CM

## 2013-03-05 NOTE — Progress Notes (Signed)
Clinical Summary [name] [name] is a medically complex 79 y.o.male last seen in August. 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 He continues to reside at a nursing center. Reports no progressive shortness of breath or chest pain, no palpitations or syncope. He does not ambulate to any major degree, uses a wheelchair. He tells that his appetite is very good, he has gained back most of the weight that he lost originally. No orthopnea or leg edema however. Medications are reviewed below.  Weight is up 10 pounds from August.  He is not undergoing any active treatment for lung cancer at this point.  No Known Allergies  Current Outpatient Prescriptions  Medication Sig Dispense Refill  . albuterol (PROAIR HFA) 108 (90 BASE) MCG/ACT inhaler Inhale 1-2 puffs into the lungs every 4 (four) hours as needed for wheezing.      . carvedilol (COREG) 3.125 MG tablet Take 1 tablet (3.125 mg total) by mouth 2 (two) times daily with a meal.  60 tablet  0  . furosemide (LASIX) 40 MG tablet Take 20 mg by mouth daily.       Marland Kitchen lisinopril (PRINIVIL,ZESTRIL) 5 MG tablet Take 1 tablet (5 mg total) by mouth daily.  30 tablet  0  . mirtazapine (REMERON) 15 MG tablet Take 15 mg by mouth at bedtime as needed (for sleep).      . potassium chloride SA (K-DUR,KLOR-CON) 20 MEQ tablet Take 20 mEq by mouth daily.       No current facility-administered medications for this visit.    Past Medical History  Diagnosis Date  . Mixed hyperlipidemia   . Essential hypertension, benign   . Myocardial infarction 2008  . DVT (deep venous thrombosis)   . Peripheral vascular disease   . Dementia   . Hiatal hernia   . Arthritis   . Depression with anxiety   . Lung cancer     Progressive - right lung mass with effusion, sees radiation oncology at [facility], [facility]  . CAD (coronary artery disease)     Details not clear  . Cardiomyopathy     LVEF 15% with biventricular failure    Social History [name] [name] reports that  he has been smoking Cigarettes.  He has been smoking about 0.00 packs per day. He does not have any smokeless tobacco history on file. [name] [name] reports that he drinks alcohol.  Review of Systems Part of hearing, no cough or hemoptysis. No fevers or chills. Otherwise as outlined.  Physical Examination Filed Vitals:   03/05/13 0914  BP: 107/58  Pulse: 79   Filed Weights   03/05/13 0914  Weight: 164 lb (74.39 kg)   Chronically ill-appearing male no acute distress.  HEENT: Conjunctiva and lids normal, oropharynx clear with poor dentition.  Neck: Supple, no carotid bruits, no thyromegaly.  Lungs: Diminished breath sounds with scattered rhonchi, nonlabored breathing at rest. No wheezing.  Cardiac: Distant heart sounds, indistinct PMI, regular rate and rhythm, no S3, no pericardial rub.  Abdomen: Soft, nontender, bowel sounds present.  Extremities: No pitting edema, distal pulses 1+.    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 His weight is up with good appetite, but no clear evidence of volume overload. No change to current regimen.  Coronary atherosclerosis of native coronary artery Reported in his history, details are not clear. Evaluation was done at another facility. He is not reporting any angina.  Satira Sark, M.D., F.A.C.C.

## 2013-03-05 NOTE — Assessment & Plan Note (Signed)
Reported in his history, details are not clear. Evaluation was done at another facility. He is not reporting any angina.

## 2013-03-05 NOTE — Patient Instructions (Signed)
Your physician recommends that you schedule a follow-up appointment in: 6 months with Dr McDowell You will receive a reminder letter two months in advance reminding you to call and schedule your appointment. If you don't receive this letter, please contact our office.  Your physician recommends that you continue on your current medications as directed. Please refer to the Current Medication list given to you today.   

## 2013-03-05 NOTE — Assessment & Plan Note (Signed)
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 His weight is up with good appetite, but no clear evidence of volume overload. No change to current regimen.

## 2013-09-23 ENCOUNTER — Emergency Department (HOSPITAL_COMMUNITY)

## 2013-09-23 ENCOUNTER — Emergency Department (HOSPITAL_COMMUNITY)
Admission: EM | Admit: 2013-09-23 | Discharge: 2013-09-23 | Disposition: A | Discharge status: Home / Self Care | Attending: Emergency Medicine | Admitting: Emergency Medicine

## 2013-09-23 ENCOUNTER — Encounter (HOSPITAL_COMMUNITY): Payer: Self-pay | Admitting: Emergency Medicine

## 2013-09-23 DIAGNOSIS — F341 Dysthymic disorder: Secondary | ICD-10-CM | POA: Insufficient documentation

## 2013-09-23 DIAGNOSIS — Z79899 Other long term (current) drug therapy: Secondary | ICD-10-CM | POA: Insufficient documentation

## 2013-09-23 DIAGNOSIS — F29 Unspecified psychosis not due to a substance or known physiological condition: Secondary | ICD-10-CM | POA: Insufficient documentation

## 2013-09-23 DIAGNOSIS — Z85118 Personal history of other malignant neoplasm of bronchus and lung: Secondary | ICD-10-CM | POA: Insufficient documentation

## 2013-09-23 DIAGNOSIS — R7989 Other specified abnormal findings of blood chemistry: Secondary | ICD-10-CM | POA: Insufficient documentation

## 2013-09-23 DIAGNOSIS — Z8639 Personal history of other endocrine, nutritional and metabolic disease: Secondary | ICD-10-CM | POA: Insufficient documentation

## 2013-09-23 DIAGNOSIS — F0390 Unspecified dementia without behavioral disturbance: Secondary | ICD-10-CM | POA: Insufficient documentation

## 2013-09-23 DIAGNOSIS — I252 Old myocardial infarction: Secondary | ICD-10-CM | POA: Insufficient documentation

## 2013-09-23 DIAGNOSIS — Z8739 Personal history of other diseases of the musculoskeletal system and connective tissue: Secondary | ICD-10-CM | POA: Insufficient documentation

## 2013-09-23 DIAGNOSIS — I2510 Atherosclerotic heart disease of native coronary artery without angina pectoris: Secondary | ICD-10-CM | POA: Insufficient documentation

## 2013-09-23 DIAGNOSIS — F17200 Nicotine dependence, unspecified, uncomplicated: Secondary | ICD-10-CM | POA: Insufficient documentation

## 2013-09-23 DIAGNOSIS — Z86718 Personal history of other venous thrombosis and embolism: Secondary | ICD-10-CM | POA: Insufficient documentation

## 2013-09-23 DIAGNOSIS — I10 Essential (primary) hypertension: Secondary | ICD-10-CM | POA: Insufficient documentation

## 2013-09-23 DIAGNOSIS — Z862 Personal history of diseases of the blood and blood-forming organs and certain disorders involving the immune mechanism: Secondary | ICD-10-CM | POA: Insufficient documentation

## 2013-09-23 DIAGNOSIS — R569 Unspecified convulsions: Secondary | ICD-10-CM | POA: Insufficient documentation

## 2013-09-23 DIAGNOSIS — E860 Dehydration: Secondary | ICD-10-CM | POA: Insufficient documentation

## 2013-09-23 DIAGNOSIS — Z8719 Personal history of other diseases of the digestive system: Secondary | ICD-10-CM | POA: Insufficient documentation

## 2013-09-23 LAB — COMPREHENSIVE METABOLIC PANEL
ALBUMIN: 3.3 g/dL — AB (ref 3.5–5.2)
ALK PHOS: 174 U/L — AB (ref 39–117)
ALT: 14 U/L (ref 0–53)
AST: 15 U/L (ref 0–37)
Anion gap: 10 (ref 5–15)
BILIRUBIN TOTAL: 0.4 mg/dL (ref 0.3–1.2)
BUN: 45 mg/dL — ABNORMAL HIGH (ref 6–23)
CHLORIDE: 104 meq/L (ref 96–112)
CO2: 25 mEq/L (ref 19–32)
Calcium: 8.9 mg/dL (ref 8.4–10.5)
Creatinine, Ser: 2.08 mg/dL — ABNORMAL HIGH (ref 0.50–1.35)
GFR calc Af Amer: 34 mL/min — ABNORMAL LOW (ref >90)
GFR calc non Af Amer: 29 mL/min — ABNORMAL LOW (ref >90)
Glucose, Bld: 125 mg/dL — ABNORMAL HIGH (ref 70–99)
POTASSIUM: 5.2 meq/L (ref 3.7–5.3)
SODIUM: 139 meq/L (ref 137–147)
Total Protein: 6.9 g/dL (ref 6.0–8.3)

## 2013-09-23 LAB — CBC WITH DIFFERENTIAL/PLATELET
BASOS ABS: 0.1 10*3/uL (ref 0.0–0.1)
BASOS PCT: 1 % (ref 0–1)
Eosinophils Absolute: 0.3 10*3/uL (ref 0.0–0.7)
Eosinophils Relative: 3 % (ref 0–5)
HCT: 37.9 % — ABNORMAL LOW (ref 39.0–52.0)
Hemoglobin: 12.7 g/dL — ABNORMAL LOW (ref 13.0–17.0)
Lymphocytes Relative: 16 % (ref 12–46)
Lymphs Abs: 1.6 10*3/uL (ref 0.7–4.0)
MCH: 29.7 pg (ref 26.0–34.0)
MCHC: 33.5 g/dL (ref 30.0–36.0)
MCV: 88.8 fL (ref 78.0–100.0)
Monocytes Absolute: 0.6 10*3/uL (ref 0.1–1.0)
Monocytes Relative: 6 % (ref 3–12)
NEUTROS ABS: 7.5 10*3/uL (ref 1.7–7.7)
NEUTROS PCT: 74 % (ref 43–77)
Platelets: 142 10*3/uL — ABNORMAL LOW (ref 150–400)
RBC: 4.27 MIL/uL (ref 4.22–5.81)
RDW: 15.9 % — AB (ref 11.5–15.5)
WBC: 10.1 10*3/uL (ref 4.0–10.5)

## 2013-09-23 MED ORDER — LEVETIRACETAM 500 MG PO TABS: 500.0000 mg | ORAL_TABLET | Freq: Two times a day (BID) | ORAL | Status: AC

## 2013-09-23 MED ORDER — SODIUM CHLORIDE 0.9 % IV BOLUS (SEPSIS)
1000.0000 mL | Freq: Once | INTRAVENOUS | Status: AC
  Administered 2013-09-23: 1000 mL via INTRAVENOUS

## 2013-09-23 MED ORDER — LEVETIRACETAM 500 MG PO TABS
500.0000 mg | ORAL_TABLET | Freq: Once | ORAL | Status: AC
  Administered 2013-09-23: 500 mg via ORAL
  Filled 2013-09-23: qty 1

## 2013-09-23 NOTE — ED Notes (Signed)
Medtech at [facility] reported that pt went unresponsive at breakfast this morning and was confused when he became responsive. Pt recently put on Hospice care of lung cancer. ALF notified hospice of pt coming to ER. PT alert and oriented on arrival to ER and stated he had another "spell" of shaking this morning and remembers everything that happened at breakfast.

## 2013-09-23 NOTE — ED Provider Notes (Signed)
CSN: [id number]     Arrival date & time 09/23/13  2836 History  This chart was scribed for Tanna Furry, MD by Elby Beck, ED Scribe. This patient was seen in room APA09/APA09 and the patient's care was started at 9:34 AM.   Chief Complaint  Patient presents with  . Shaking    The history is provided by the patient. No language interpreter was used.    HPI Comments: Haylen Shelnutt Parlett is a 80 y.o. male with a history of lung CA who presents to the Emergency Department from [facility] complaining of an episode of seizure-like activity, characterized as generalized shaking that occurred earlier today. He states that his shaking caused him to spill cereal all over himself. Per the Med Tech at [facility], pt had a brief period of unresponsiveness following the shaking. Pt states that he has a history of similar shaking episodes every week. Pt states that his daughter has witnessed one of these episodes before and she believes he has seizures. Pt has a history of lung CA and he last received radiation in June 2015. Pt denies any recent falls or head injuries. Pt denies any fever, SOB, visual disturbances or any other pain or symptoms.    Past Medical History  Diagnosis Date  . Mixed hyperlipidemia   . Essential hypertension, benign   . Myocardial infarction 2008  . DVT (deep venous thrombosis)   . Peripheral vascular disease   . Dementia   . Hiatal hernia   . Arthritis   . Depression with anxiety   . Lung cancer     Progressive - right lung mass with effusion, sees radiation oncology at [facility]  . CAD (coronary artery disease)     Details not clear  . Cardiomyopathy     LVEF 15% with biventricular failure   Past Surgical History  Procedure Laterality Date  . Pr vein bypass graft,aorto-fem-pop  09-15-2008    Left axillo bifemoral BPG by Dr. Drucie Opitz  . Pr vein bypass graft,aorto-fem-pop  1999    Right Femoral- Popliteal BPG by Dr. Amedeo Plenty  . Lung lobectomy  1999    Left upper  Lobectomy and mediastinal lymph node dissection  ( Dr. Halford Chessman)   History reviewed. No pertinent family history. History  Substance Use Topics  . Smoking status: Current Every Day Smoker -- 0.50 packs/day    Types: Cigarettes  . Smokeless tobacco: Not on file     Comment: 4 ciggs per day   . Alcohol Use: No     Comment: 1 pint per day    Review of Systems  Constitutional: Negative for fever, chills, diaphoresis, appetite change and fatigue.  HENT: Negative for mouth sores, sore throat and trouble swallowing.   Eyes: Negative for visual disturbance.  Respiratory: Negative for cough, chest tightness, shortness of breath and wheezing.   Cardiovascular: Negative for chest pain.  Gastrointestinal: Negative for nausea, vomiting, abdominal pain, diarrhea and abdominal distention.  Endocrine: Negative for polydipsia, polyphagia and polyuria.  Genitourinary: Negative for dysuria, frequency and hematuria.  Musculoskeletal: Negative for gait problem.  Skin: Negative for color change, pallor and rash.  Neurological: Positive for seizures. Negative for dizziness, syncope, light-headedness and headaches.  Hematological: Does not bruise/bleed easily.  Psychiatric/Behavioral: Positive for confusion. Negative for behavioral problems.    Allergies  Review of patient's allergies indicates no known allergies.  Home Medications   Prior to Admission medications   Medication Sig Start Date End Date Taking? Authorizing Provider  carvedilol (COREG) 3.125 MG tablet Take 1 tablet (3.125 mg total) by mouth 2 (two) times daily with a meal. 05/15/12  Yes Nimish C Gosrani, MD  furosemide (LASIX) 40 MG tablet Take 40 mg by mouth daily.    Yes Historical Provider, MD  guaifenesin (ROBITUSSIN) 100 MG/5ML syrup Take 100 mg by mouth 5 (five) times daily as needed for cough.   Yes Historical Provider, MD  hydrOXYzine (ATARAX/VISTARIL) 25 MG tablet Take 25 mg by mouth 3 (three) times daily as needed for  itching.   Yes Historical Provider, MD  lisinopril (PRINIVIL,ZESTRIL) 5 MG tablet Take 1 tablet (5 mg total) by mouth daily. 05/15/12  Yes Nimish Luther Parody, MD  mirtazapine (REMERON) 15 MG tablet Take 15 mg by mouth at bedtime.    Yes Historical Provider, MD  polyethylene glycol (MIRALAX / GLYCOLAX) packet Take 17 g by mouth 2 (two) times daily as needed for moderate constipation.   Yes Historical Provider, MD  potassium chloride SA (K-DUR,KLOR-CON) 20 MEQ tablet Take 20 mEq by mouth daily.   Yes Historical Provider, MD  Skin Protectants, Misc. (EUCERIN) cream Apply 1 application topically 2 (two) times daily.   Yes Historical Provider, MD  albuterol (PROAIR HFA) 108 (90 BASE) MCG/ACT inhaler Inhale 1-2 puffs into the lungs every 4 (four) hours as needed for wheezing.    Historical Provider, MD  levETIRAcetam (KEPPRA) 500 MG tablet Take 1 tablet (500 mg total) by mouth 2 (two) times daily. 09/23/13   Tanna Furry, MD   Triage Vitals: BP 108/43  Pulse 82  Temp(Src) 97.9 F (36.6 C) (Oral)  Resp 18  Ht 6' (1.829 m)  Wt 160 lb (72.576 kg)  BMI 21.70 kg/m2  SpO2 98%  Physical Exam  Nursing note and vitals reviewed. Constitutional: He is oriented to person, place, and time. He appears well-developed and well-nourished. No distress.  HENT:  Head: Normocephalic.  Eyes: Conjunctivae are normal. Pupils are equal, round, and reactive to light. No scleral icterus.  Neck: Normal range of motion. Neck supple. No thyromegaly present.  Cardiovascular: Normal rate and regular rhythm.  Exam reveals no gallop and no friction rub.   No murmur heard. Pulmonary/Chest: Effort normal and breath sounds normal. No respiratory distress. He has no wheezes. He has no rales.  Abdominal: Soft. Bowel sounds are normal. He exhibits no distension. There is no tenderness. There is no rebound.  Musculoskeletal: Normal range of motion.  Neurological: He is alert and oriented to person, place, and time. He has normal  reflexes. No cranial nerve deficit.  No pronator drift  Skin: Skin is warm and dry. No rash noted.  Psychiatric: He has a normal mood and affect. His behavior is normal.    ED Course  Procedures (including critical care time)  DIAGNOSTIC STUDIES: Oxygen Saturation is 98% on RA, normal by my interpretation.    COORDINATION OF CARE: 9:39 AM- Pt advised of plan for treatment and pt agrees.  Labs Review Labs Reviewed  CBC WITH DIFFERENTIAL - Abnormal; Notable for the following:    Hemoglobin 12.7 (*)    HCT 37.9 (*)    RDW 15.9 (*)    Platelets 142 (*)    All other components within normal limits  COMPREHENSIVE METABOLIC PANEL - Abnormal; Notable for the following:    Glucose, Bld 125 (*)    BUN 45 (*)    Creatinine, Ser 2.08 (*)    Albumin 3.3 (*)    Alkaline Phosphatase 174 (*)  GFR calc non Af Amer 29 (*)    GFR calc Af Amer 34 (*)    All other components within normal limits    Imaging Review Dg Chest 1 View  09/23/2013   CLINICAL DATA:  Weakness.  EXAM: CHEST - 1 VIEW  COMPARISON:  05/14/2012  FINDINGS: Midline trachea. Normal heart size. Atherosclerosis in the transverse aorta. Right costophrenic angle excluded. No pleural effusion or pneumothorax. Right infrahilar volume loss with probable atelectasis or scar. Relative lucency throughout the left lung is likely due to emphysema. This appearance was similar on 04/01/2009. Surgical changes about the left lung and left-sided mediastinum.  IMPRESSION: Emphysema, without acute superimposed process.  Probable atelectasis or scarring in the right infrahilar region. Early infection is felt less likely. If symptoms warrant, consider short term radiographic followup.   Electronically Signed   By: Abigail Miyamoto M.D.   On: 09/23/2013 11:27   Ct Head Wo Contrast  09/23/2013   CLINICAL DATA:  Mental status change; in hospice care for lung malignancy  EXAM: CT HEAD WITHOUT CONTRAST  TECHNIQUE: Contiguous axial images were obtained from  the base of the skull through the vertex without intravenous contrast.  COMPARISON:  Noncontrast CT scan of the brain dated April 01, 2009 and MRI of the brain of the same date  FINDINGS: There is mild diffuse cerebral and cerebellar atrophy with compensatory ventriculomegaly. There is no acute intracranial hemorrhage nor intracranial mass effect. There is stable deep white matter hypodensity in both cerebral hemispheres consistent with chronic small vessel ischemic change. Old lacunar infarctions in the right cerebellar hemisphere are present.  There is partial opacification of the right maxillary sinus, several right ethmoid sinus cells and mild mucoperiosteal thickening of the dominant left sphenoid sinus cell. The mastoid air cells are well pneumatized. There is no acute skull fracture. There is likely a sebaceous cyst over the right frontal region.  IMPRESSION: 1. There is no acute intracranial hemorrhage nor evidence of an acute ischemic event. There is no intracranial mass effect. There are mild age related changes of chronic small vessel ischemia. 2. There is inflammatory change of the right maxillary, right ethmoid, and left sphenoid sinus cells. 3. There is no skull fracture.   Electronically Signed   By: David  Martinique   On: 09/23/2013 11:07     EKG Interpretation None      MDM   Final diagnoses:  Seizure  Dehydration  Azotemia    I discussed hospitalization with the patient. He presently declines. We discussed starting on Keppra for a seizure disorder. He is azotemic. Encouraged and increase his fluid intake. Disposition went back to his care facility for continued hospice care. CT shows no sign of CNS metastases of his known pulmonary malignancy.  I personally performed the services described in this documentation, which was scribed in my presence. The recorded information has been reviewed and is accurate.   Tanna Furry, MD 09/23/13 1339

## 2013-09-23 NOTE — Discharge Instructions (Signed)
Drink plenty of fluids. You're being started on a new medication for seizures, Keppra. Return to emergency room as needed for any difficulties.  Dehydration, Adult Dehydration means your body does not have as much fluid as it needs. Your kidneys, brain, and heart will not work properly without the right amount of fluids and salt.  HOME CARE  Ask your doctor how to replace body fluid losses (rehydrate).  Drink enough fluids to keep your pee (urine) clear or pale yellow.  Drink small amounts of fluids often if you feel sick to your stomach (nauseous) or throw up (vomit).  Eat like you normally do.  Avoid:  Foods or drinks high in sugar.  Bubbly (carbonated) drinks.  Juice.  Very hot or cold fluids.  Drinks with caffeine.  Fatty, greasy foods.  Alcohol.  Tobacco.  Eating too much.  Gelatin desserts.  Wash your hands to avoid spreading germs (bacteria, viruses).  Only take medicine as told by your doctor.  Keep all doctor visits as told. GET HELP RIGHT AWAY IF:   You cannot drink something without throwing up.  You get worse even with treatment.  Your vomit has blood in it or looks greenish.  Your poop (stool) has blood in it or looks black and tarry.  You have not peed in 6 to 8 hours.  You pee a small amount of very dark pee.  You have a fever.  You pass out (faint).  You have belly (abdominal) pain that gets worse or stays in one spot (localizes).  You have a rash, stiff neck, or bad headache.  You get easily annoyed, sleepy, or are hard to wake up.  You feel weak, dizzy, or very thirsty. MAKE SURE YOU:   Understand these instructions.  Will watch your condition.  Will get help right away if you are not doing well or get worse. Document Released: 12/18/2008 Document Revised: 05/16/2011 Document Reviewed: 10/11/2010 [facility] Patient Information 2015 [place], [place]. This information is not intended to replace advice given to you by your health  care provider. Make sure you discuss any questions you have with your health care provider.  Seizure, Adult A seizure is abnormal electrical activity in the brain. Seizures usually last from 30 seconds to 2 minutes. There are various types of seizures. Before a seizure, you may have a warning sensation (aura) that a seizure is about to occur. An aura may include the following symptoms:   Fear or anxiety.  Nausea.  Feeling like the room is spinning (vertigo).  Vision changes, such as seeing flashing lights or spots. Common symptoms during a seizure include:  A change in attention or behavior (altered mental status).  Convulsions with rhythmic jerking movements.  Drooling.  Rapid eye movements.  Grunting.  Loss of bladder and bowel control.  Bitter taste in the mouth.  Tongue biting. After a seizure, you may feel confused and sleepy. You may also have an injury resulting from convulsions during the seizure. HOME CARE INSTRUCTIONS   If you are given medicines, take them exactly as prescribed by your health care provider.  Keep all follow-up appointments as directed by your health care provider.  Do not swim or drive or engage in risky activity during which a seizure could cause further injury to you or others until your health care provider says it is OK.  Get adequate rest.  Teach friends and family what to do if you have a seizure. They should:  Lay you on the ground to prevent  a fall.  Put a cushion under your head.  Loosen any tight clothing around your neck.  Turn you on your side. If vomiting occurs, this helps keep your airway clear.  Stay with you until you recover.  Know whether or not you need emergency care. SEEK IMMEDIATE MEDICAL CARE IF:  The seizure lasts longer than 5 minutes.  The seizure is severe or you do not wake up immediately after the seizure.  You have an altered mental status after the seizure.  You are having more frequent or  worsening seizures. Someone should drive you to the emergency department or call local emergency services (911 in [place] MAKE SURE YOU:  Understand these instructions.  Will watch your condition.  Will get help right away if you are not doing well or get worse. Document Released: 02/19/2000 Document Revised: 12/12/2012 Document Reviewed: 10/03/2012 [facility] Patient Information 2015 [place], [place]. This information is not intended to replace advice given to you by your health care provider. Make sure you discuss any questions you have with your health care provider.

## 2014-05-06 DEATH — deceased

## 2015-04-02 IMAGING — CT CT HEAD W/O CM
1 series · 15 of 30 positions shown, 19 images · non-contrast
Comparison: Noncontrast CT scan of the brain dated April 01, 2009
and MRI of the brain of the same date

CLINICAL DATA: Mental status change; in hospice care for lung
malignancy

EXAM:
CT HEAD WITHOUT CONTRAST
TECHNIQUE: Contiguous axial images were obtained from the base of the skull
through the vertex without intravenous contrast.

[Series 2: headtrauma 4.8 h37s · axial · 0.43mm/px · z∈[+116,+268]mm · 15 of 36 slices shown, 19 images]
[im 2/36  brain]
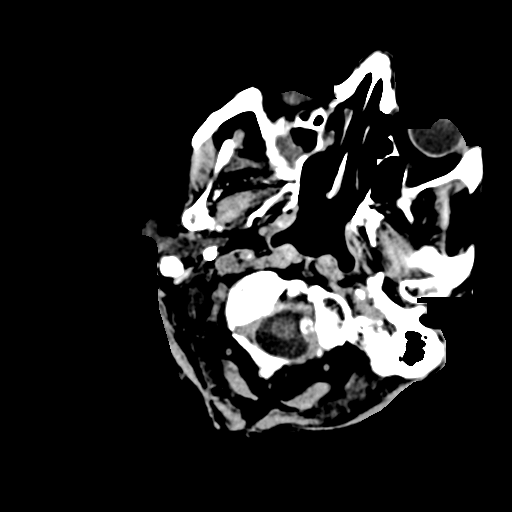
[im 2/36  bone]
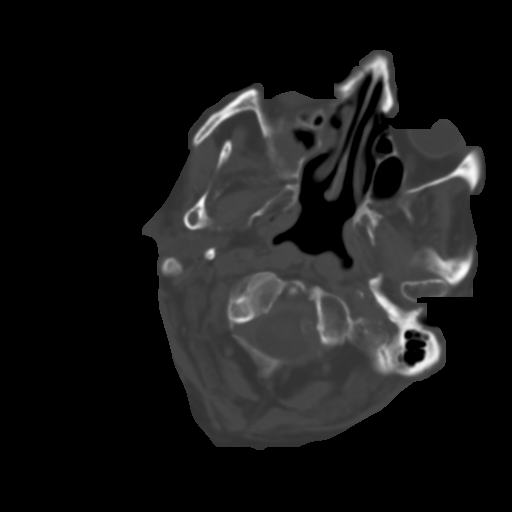
[im 4/36  brain]
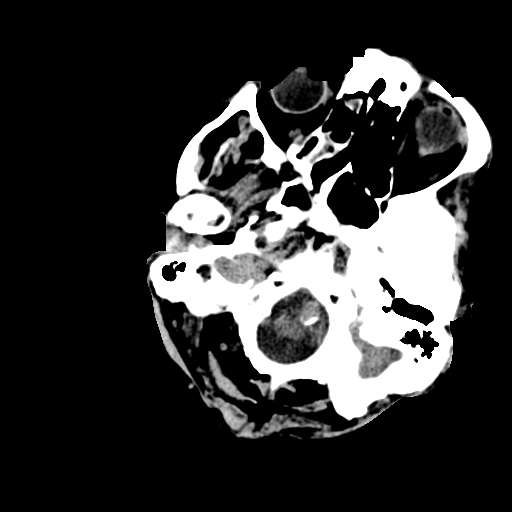
[im 7/36  brain]
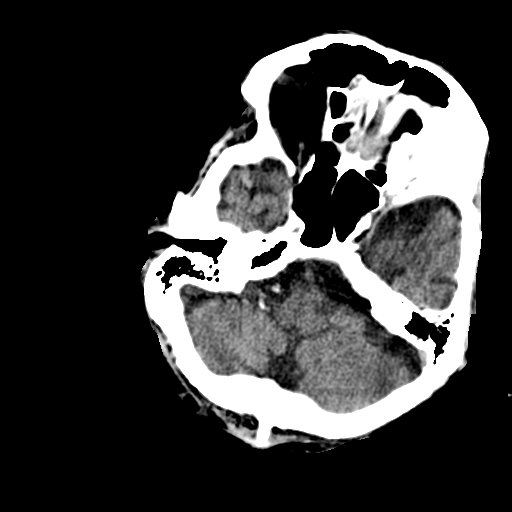
[im 9/36  brain]
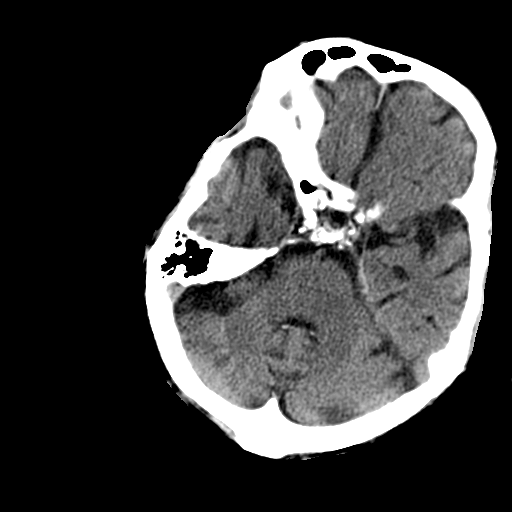
[im 11/36  brain]
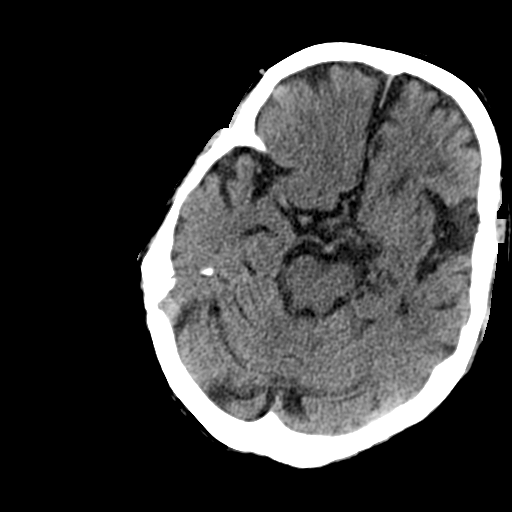
[im 11/36  bone]
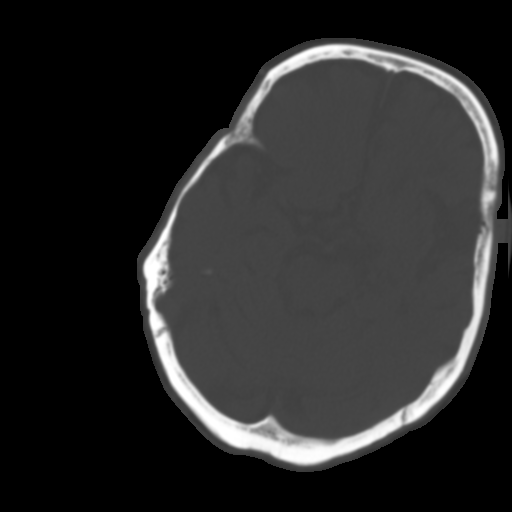
[im 14/36  brain]
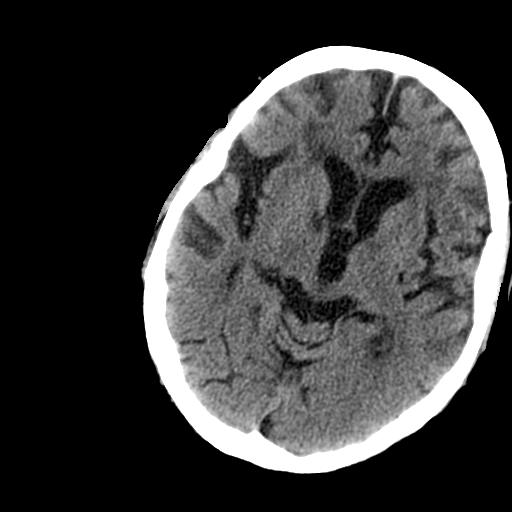
[im 16/36  brain]
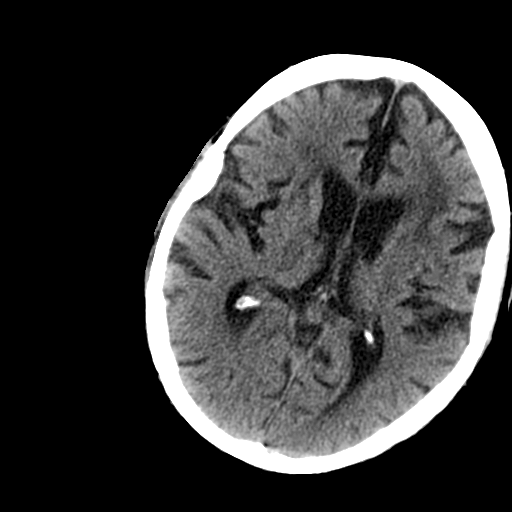
[im 19/36  brain]
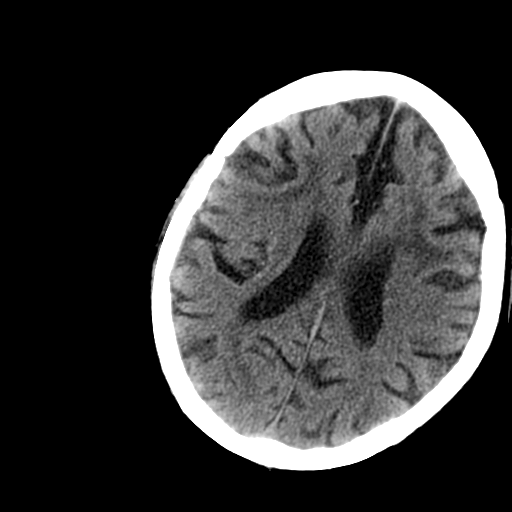
[im 20/36  brain]
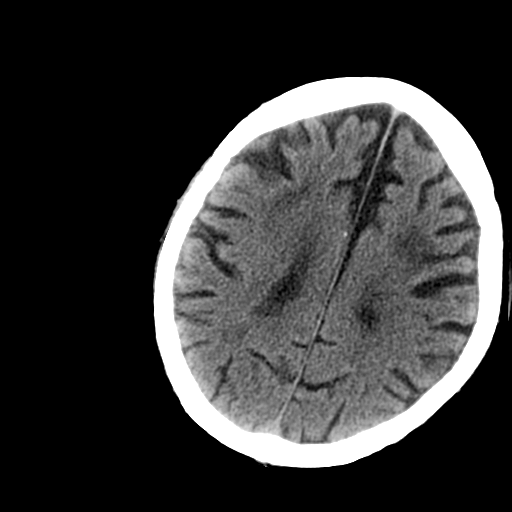
[im 20/36  bone]
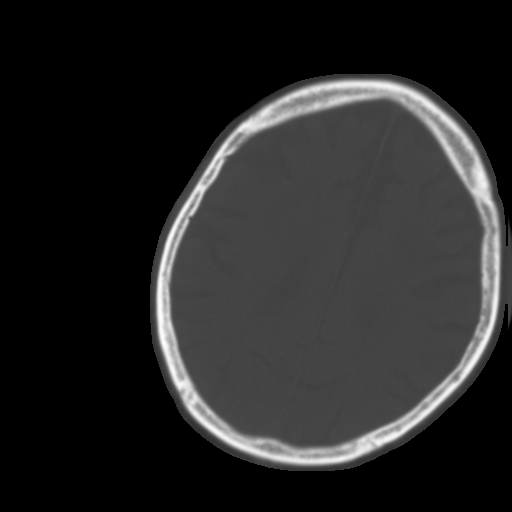
[im 22/36  brain]
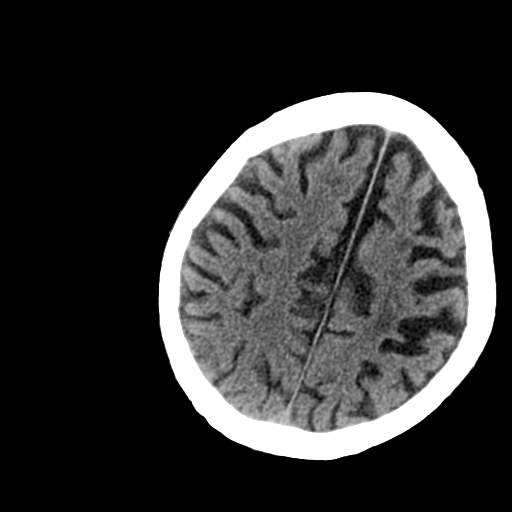
[im 25/36  brain]
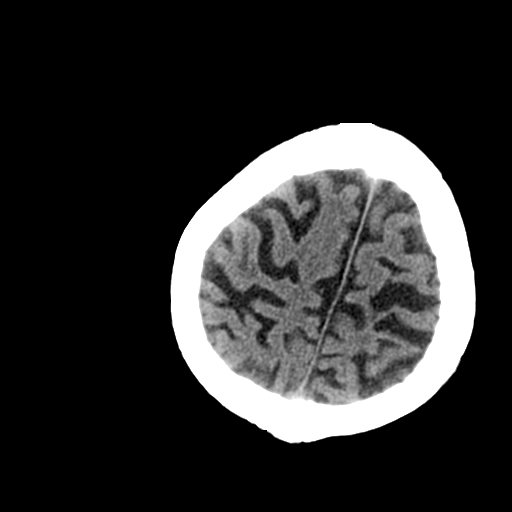
[im 27/36  brain]
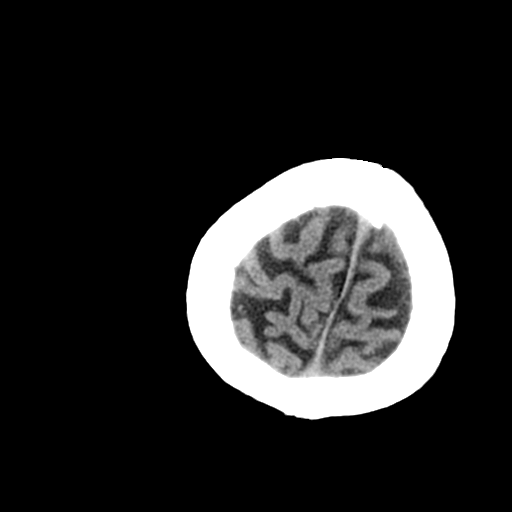
[im 29/36  brain]
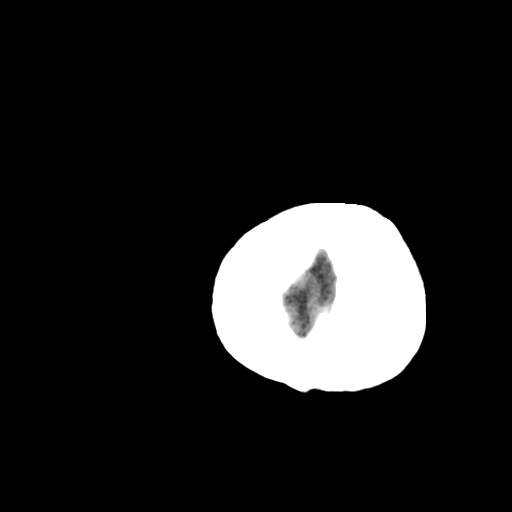
[im 29/36  bone]
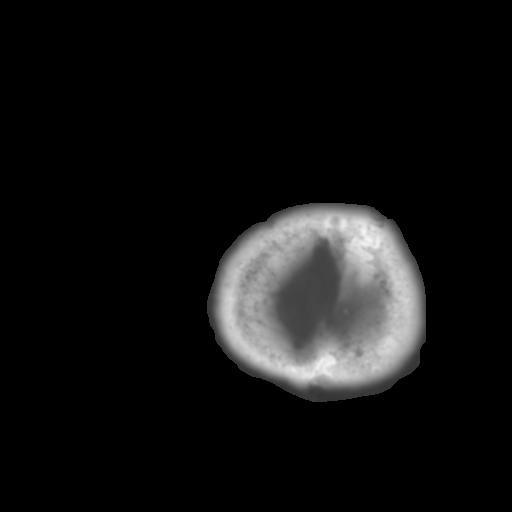
[im 32/36  brain]
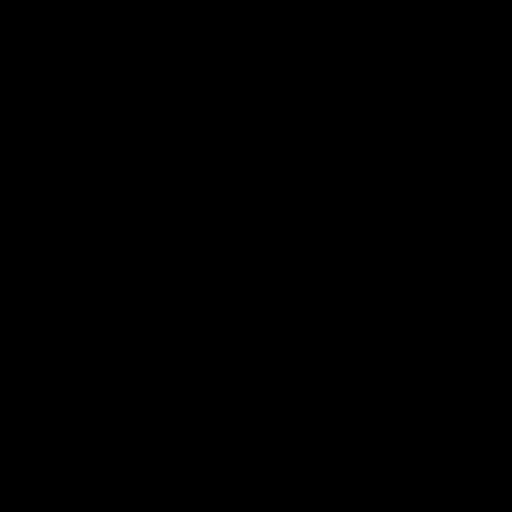
[im 34/36  brain]
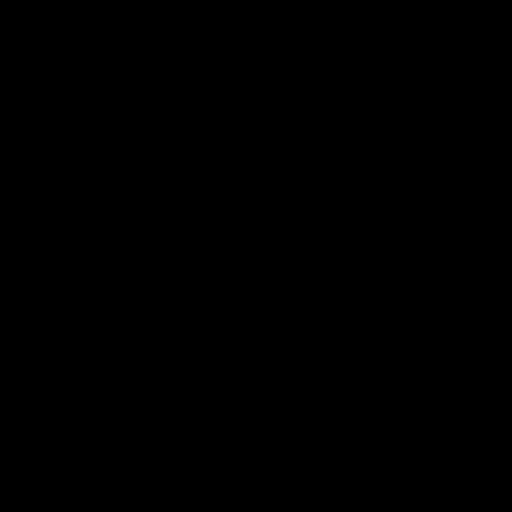

[15 of 30 positions shown; findings below may reference images not displayed]

FINDINGS: There is mild diffuse cerebral and cerebellar atrophy with
compensatory ventriculomegaly. There is no acute intracranial
hemorrhage nor intracranial mass effect. There is stable deep white
matter hypodensity in both cerebral hemispheres consistent with
chronic small vessel ischemic change. Old lacunar infarctions in the
right cerebellar hemisphere are present.

There is partial opacification of the right maxillary sinus, several
right ethmoid sinus cells and mild mucoperiosteal thickening of the
dominant left sphenoid sinus cell. The mastoid air cells are well
pneumatized. There is no acute skull fracture. There is likely a
sebaceous cyst over the right frontal region.
IMPRESSION: 1. There is no acute intracranial hemorrhage nor evidence of an
acute ischemic event. There is no intracranial mass effect. There
are mild age related changes of chronic small vessel ischemia.
2. There is inflammatory change of the right maxillary, right
ethmoid, and left sphenoid sinus cells.
3. There is no skull fracture.
